# Patient Record
Sex: Male | Born: 1975
Health system: Southern US, Community
[De-identification: ages and names within clinical notes are randomized; demographics above are authoritative.]

## PROBLEM LIST (undated history)

## (undated) DIAGNOSIS — I10 Essential (primary) hypertension: Secondary | ICD-10-CM

## (undated) DIAGNOSIS — I809 Phlebitis and thrombophlebitis of unspecified site: Secondary | ICD-10-CM

## (undated) DIAGNOSIS — D72829 Elevated white blood cell count, unspecified: Principal | ICD-10-CM

## (undated) DIAGNOSIS — E119 Type 2 diabetes mellitus without complications: Secondary | ICD-10-CM

## (undated) DIAGNOSIS — T1490XA Injury, unspecified, initial encounter: Secondary | ICD-10-CM

## (undated) DIAGNOSIS — I2699 Other pulmonary embolism without acute cor pulmonale: Secondary | ICD-10-CM

## (undated) HISTORY — PX: SKIN GRAFT: SHX250

## (undated) HISTORY — DX: Essential (primary) hypertension: I10

## (undated) HISTORY — DX: Type 2 diabetes mellitus without complications: E11.9

## (undated) HISTORY — DX: Elevated white blood cell count, unspecified: D72.829

---

## 2001-10-18 HISTORY — PX: OTHER SURGICAL HISTORY: SHX169

## 2002-01-18 ENCOUNTER — Emergency Department (HOSPITAL_COMMUNITY): Admission: EM | Admit: 2002-01-18 | Discharge: 2002-01-18 | Payer: Self-pay | Admitting: Emergency Medicine

## 2002-03-03 ENCOUNTER — Emergency Department (HOSPITAL_COMMUNITY): Admission: EM | Admit: 2002-03-03 | Discharge: 2002-03-03 | Payer: Self-pay | Admitting: Emergency Medicine

## 2002-03-13 ENCOUNTER — Emergency Department (HOSPITAL_COMMUNITY): Admission: EM | Admit: 2002-03-13 | Discharge: 2002-03-13 | Payer: Self-pay | Admitting: Emergency Medicine

## 2002-03-17 ENCOUNTER — Emergency Department (HOSPITAL_COMMUNITY): Admission: EM | Admit: 2002-03-17 | Discharge: 2002-03-17 | Payer: Self-pay | Admitting: Unknown Physician Specialty

## 2002-03-17 ENCOUNTER — Encounter: Payer: Self-pay | Admitting: Emergency Medicine

## 2002-03-21 ENCOUNTER — Emergency Department (HOSPITAL_COMMUNITY): Admission: EM | Admit: 2002-03-21 | Discharge: 2002-03-22 | Payer: Self-pay | Admitting: Emergency Medicine

## 2002-03-26 ENCOUNTER — Emergency Department (HOSPITAL_COMMUNITY): Admission: EM | Admit: 2002-03-26 | Discharge: 2002-03-26 | Payer: Self-pay | Admitting: Emergency Medicine

## 2002-04-08 ENCOUNTER — Emergency Department (HOSPITAL_COMMUNITY): Admission: EM | Admit: 2002-04-08 | Discharge: 2002-04-08 | Payer: Self-pay | Admitting: Emergency Medicine

## 2002-04-30 ENCOUNTER — Encounter (INDEPENDENT_AMBULATORY_CARE_PROVIDER_SITE_OTHER): Payer: Self-pay | Admitting: Specialist

## 2002-04-30 ENCOUNTER — Inpatient Hospital Stay (HOSPITAL_COMMUNITY): Admission: EM | Admit: 2002-04-30 | Discharge: 2002-05-01 | Payer: Self-pay | Admitting: Emergency Medicine

## 2002-05-14 ENCOUNTER — Emergency Department (HOSPITAL_COMMUNITY): Admission: EM | Admit: 2002-05-14 | Discharge: 2002-05-14 | Payer: Self-pay | Admitting: Emergency Medicine

## 2002-06-03 ENCOUNTER — Emergency Department (HOSPITAL_COMMUNITY): Admission: EM | Admit: 2002-06-03 | Discharge: 2002-06-03 | Payer: Self-pay | Admitting: Emergency Medicine

## 2002-06-12 ENCOUNTER — Emergency Department (HOSPITAL_COMMUNITY): Admission: EM | Admit: 2002-06-12 | Discharge: 2002-06-12 | Payer: Self-pay

## 2002-06-15 ENCOUNTER — Emergency Department (HOSPITAL_COMMUNITY): Admission: EM | Admit: 2002-06-15 | Discharge: 2002-06-15 | Payer: Self-pay | Admitting: Emergency Medicine

## 2002-06-20 ENCOUNTER — Ambulatory Visit (HOSPITAL_COMMUNITY): Admission: RE | Admit: 2002-06-20 | Discharge: 2002-06-20 | Payer: Self-pay | Admitting: Surgery

## 2002-06-20 ENCOUNTER — Encounter: Payer: Self-pay | Admitting: Surgery

## 2002-07-15 ENCOUNTER — Emergency Department (HOSPITAL_COMMUNITY): Admission: EM | Admit: 2002-07-15 | Discharge: 2002-07-15 | Payer: Self-pay

## 2002-07-23 ENCOUNTER — Emergency Department (HOSPITAL_COMMUNITY): Admission: EM | Admit: 2002-07-23 | Discharge: 2002-07-23 | Payer: Self-pay | Admitting: Emergency Medicine

## 2002-08-02 ENCOUNTER — Emergency Department (HOSPITAL_COMMUNITY): Admission: EM | Admit: 2002-08-02 | Discharge: 2002-08-02 | Payer: Self-pay | Admitting: Emergency Medicine

## 2002-08-10 ENCOUNTER — Emergency Department (HOSPITAL_COMMUNITY): Admission: EM | Admit: 2002-08-10 | Discharge: 2002-08-10 | Payer: Self-pay | Admitting: Emergency Medicine

## 2002-09-17 ENCOUNTER — Emergency Department (HOSPITAL_COMMUNITY): Admission: EM | Admit: 2002-09-17 | Discharge: 2002-09-17 | Payer: Self-pay | Admitting: Emergency Medicine

## 2002-11-14 ENCOUNTER — Encounter: Payer: Self-pay | Admitting: Emergency Medicine

## 2002-11-14 ENCOUNTER — Emergency Department (HOSPITAL_COMMUNITY): Admission: EM | Admit: 2002-11-14 | Discharge: 2002-11-14 | Payer: Self-pay | Admitting: Emergency Medicine

## 2003-03-25 ENCOUNTER — Emergency Department (HOSPITAL_COMMUNITY): Admission: AD | Admit: 2003-03-25 | Discharge: 2003-03-25 | Payer: Self-pay

## 2003-04-08 ENCOUNTER — Emergency Department (HOSPITAL_COMMUNITY): Admission: EM | Admit: 2003-04-08 | Discharge: 2003-04-08 | Payer: Self-pay | Admitting: Emergency Medicine

## 2003-09-01 ENCOUNTER — Emergency Department (HOSPITAL_COMMUNITY): Admission: EM | Admit: 2003-09-01 | Discharge: 2003-09-01 | Payer: Self-pay | Admitting: Emergency Medicine

## 2003-09-06 ENCOUNTER — Emergency Department (HOSPITAL_COMMUNITY): Admission: EM | Admit: 2003-09-06 | Discharge: 2003-09-06 | Payer: Self-pay | Admitting: Emergency Medicine

## 2003-10-06 ENCOUNTER — Emergency Department (HOSPITAL_COMMUNITY): Admission: EM | Admit: 2003-10-06 | Discharge: 2003-10-06 | Payer: Self-pay | Admitting: Emergency Medicine

## 2003-10-13 ENCOUNTER — Emergency Department (HOSPITAL_COMMUNITY): Admission: EM | Admit: 2003-10-13 | Discharge: 2003-10-13 | Payer: Self-pay | Admitting: Emergency Medicine

## 2003-11-29 ENCOUNTER — Emergency Department (HOSPITAL_COMMUNITY): Admission: EM | Admit: 2003-11-29 | Discharge: 2003-11-30 | Payer: Self-pay | Admitting: Emergency Medicine

## 2004-04-11 ENCOUNTER — Emergency Department (HOSPITAL_COMMUNITY): Admission: EM | Admit: 2004-04-11 | Discharge: 2004-04-11 | Payer: Self-pay | Admitting: Emergency Medicine

## 2004-05-15 ENCOUNTER — Emergency Department (HOSPITAL_COMMUNITY): Admission: EM | Admit: 2004-05-15 | Discharge: 2004-05-15 | Payer: Self-pay | Admitting: Emergency Medicine

## 2004-06-15 ENCOUNTER — Emergency Department (HOSPITAL_COMMUNITY): Admission: EM | Admit: 2004-06-15 | Discharge: 2004-06-15 | Payer: Self-pay | Admitting: Emergency Medicine

## 2004-09-03 ENCOUNTER — Emergency Department (HOSPITAL_COMMUNITY): Admission: EM | Admit: 2004-09-03 | Discharge: 2004-09-03 | Payer: Self-pay | Admitting: Emergency Medicine

## 2004-09-06 ENCOUNTER — Emergency Department (HOSPITAL_COMMUNITY): Admission: EM | Admit: 2004-09-06 | Discharge: 2004-09-06 | Payer: Self-pay | Admitting: Emergency Medicine

## 2004-09-07 ENCOUNTER — Observation Stay (HOSPITAL_COMMUNITY): Admission: EM | Admit: 2004-09-07 | Discharge: 2004-09-08 | Payer: Self-pay | Admitting: Emergency Medicine

## 2004-09-07 ENCOUNTER — Emergency Department (HOSPITAL_COMMUNITY): Admission: EM | Admit: 2004-09-07 | Discharge: 2004-09-08 | Payer: Self-pay | Admitting: Emergency Medicine

## 2004-10-17 ENCOUNTER — Emergency Department (HOSPITAL_COMMUNITY): Admission: EM | Admit: 2004-10-17 | Discharge: 2004-10-17 | Payer: Self-pay | Admitting: Emergency Medicine

## 2004-11-22 ENCOUNTER — Emergency Department (HOSPITAL_COMMUNITY): Admission: EM | Admit: 2004-11-22 | Discharge: 2004-11-22 | Payer: Self-pay | Admitting: Emergency Medicine

## 2005-02-22 ENCOUNTER — Emergency Department (HOSPITAL_COMMUNITY): Admission: EM | Admit: 2005-02-22 | Discharge: 2005-02-23 | Payer: Self-pay | Admitting: Emergency Medicine

## 2005-06-23 ENCOUNTER — Emergency Department (HOSPITAL_COMMUNITY): Admission: EM | Admit: 2005-06-23 | Discharge: 2005-06-23 | Payer: Self-pay | Admitting: Emergency Medicine

## 2005-07-10 ENCOUNTER — Emergency Department (HOSPITAL_COMMUNITY): Admission: EM | Admit: 2005-07-10 | Discharge: 2005-07-10 | Payer: Self-pay | Admitting: Emergency Medicine

## 2005-07-13 ENCOUNTER — Emergency Department (HOSPITAL_COMMUNITY): Admission: EM | Admit: 2005-07-13 | Discharge: 2005-07-13 | Payer: Self-pay | Admitting: Family Medicine

## 2005-11-14 ENCOUNTER — Emergency Department (HOSPITAL_COMMUNITY): Admission: EM | Admit: 2005-11-14 | Discharge: 2005-11-14 | Payer: Self-pay | Admitting: Emergency Medicine

## 2006-01-03 ENCOUNTER — Emergency Department (HOSPITAL_COMMUNITY): Admission: EM | Admit: 2006-01-03 | Discharge: 2006-01-03 | Payer: Self-pay | Admitting: Emergency Medicine

## 2006-02-13 ENCOUNTER — Emergency Department (HOSPITAL_COMMUNITY): Admission: EM | Admit: 2006-02-13 | Discharge: 2006-02-13 | Payer: Self-pay | Admitting: Emergency Medicine

## 2006-04-05 ENCOUNTER — Emergency Department (HOSPITAL_COMMUNITY): Admission: EM | Admit: 2006-04-05 | Discharge: 2006-04-05 | Payer: Self-pay | Admitting: Family Medicine

## 2006-04-08 ENCOUNTER — Emergency Department (HOSPITAL_COMMUNITY): Admission: EM | Admit: 2006-04-08 | Discharge: 2006-04-08 | Payer: Self-pay | Admitting: Family Medicine

## 2006-05-21 ENCOUNTER — Emergency Department (HOSPITAL_COMMUNITY): Admission: EM | Admit: 2006-05-21 | Discharge: 2006-05-21 | Payer: Self-pay | Admitting: Emergency Medicine

## 2006-07-18 ENCOUNTER — Emergency Department (HOSPITAL_COMMUNITY): Admission: EM | Admit: 2006-07-18 | Discharge: 2006-07-18 | Payer: Self-pay | Admitting: Family Medicine

## 2006-10-05 ENCOUNTER — Emergency Department (HOSPITAL_COMMUNITY): Admission: EM | Admit: 2006-10-05 | Discharge: 2006-10-05 | Payer: Self-pay | Admitting: Emergency Medicine

## 2006-11-19 ENCOUNTER — Emergency Department (HOSPITAL_COMMUNITY): Admission: EM | Admit: 2006-11-19 | Discharge: 2006-11-19 | Payer: Self-pay | Admitting: Emergency Medicine

## 2007-02-01 ENCOUNTER — Emergency Department (HOSPITAL_COMMUNITY): Admission: EM | Admit: 2007-02-01 | Discharge: 2007-02-01 | Payer: Self-pay | Admitting: Emergency Medicine

## 2007-02-24 ENCOUNTER — Emergency Department (HOSPITAL_COMMUNITY): Admission: EM | Admit: 2007-02-24 | Discharge: 2007-02-25 | Payer: Self-pay | Admitting: Emergency Medicine

## 2007-04-04 ENCOUNTER — Emergency Department (HOSPITAL_COMMUNITY): Admission: EM | Admit: 2007-04-04 | Discharge: 2007-04-04 | Payer: Self-pay | Admitting: Emergency Medicine

## 2007-04-23 ENCOUNTER — Emergency Department (HOSPITAL_COMMUNITY): Admission: EM | Admit: 2007-04-23 | Discharge: 2007-04-23 | Payer: Self-pay | Admitting: Emergency Medicine

## 2007-05-01 ENCOUNTER — Emergency Department (HOSPITAL_COMMUNITY): Admission: EM | Admit: 2007-05-01 | Discharge: 2007-05-02 | Payer: Self-pay | Admitting: Emergency Medicine

## 2007-05-02 ENCOUNTER — Emergency Department (HOSPITAL_COMMUNITY): Admission: EM | Admit: 2007-05-02 | Discharge: 2007-05-02 | Payer: Self-pay | Admitting: Emergency Medicine

## 2007-05-03 ENCOUNTER — Emergency Department (HOSPITAL_COMMUNITY): Admission: EM | Admit: 2007-05-03 | Discharge: 2007-05-04 | Payer: Self-pay | Admitting: Emergency Medicine

## 2007-06-09 ENCOUNTER — Emergency Department (HOSPITAL_COMMUNITY): Admission: EM | Admit: 2007-06-09 | Discharge: 2007-06-09 | Payer: Self-pay | Admitting: Emergency Medicine

## 2007-08-15 ENCOUNTER — Emergency Department (HOSPITAL_COMMUNITY): Admission: EM | Admit: 2007-08-15 | Discharge: 2007-08-15 | Payer: Self-pay | Admitting: Emergency Medicine

## 2008-02-17 ENCOUNTER — Emergency Department (HOSPITAL_COMMUNITY): Admission: EM | Admit: 2008-02-17 | Discharge: 2008-02-17 | Payer: Self-pay | Admitting: Emergency Medicine

## 2008-04-04 ENCOUNTER — Ambulatory Visit: Payer: Self-pay | Admitting: Internal Medicine

## 2008-04-04 DIAGNOSIS — R0609 Other forms of dyspnea: Secondary | ICD-10-CM | POA: Insufficient documentation

## 2008-04-04 DIAGNOSIS — I10 Essential (primary) hypertension: Secondary | ICD-10-CM | POA: Insufficient documentation

## 2008-06-22 ENCOUNTER — Emergency Department (HOSPITAL_COMMUNITY): Admission: EM | Admit: 2008-06-22 | Discharge: 2008-06-22 | Payer: Self-pay | Admitting: Emergency Medicine

## 2008-08-30 ENCOUNTER — Emergency Department (HOSPITAL_COMMUNITY): Admission: EM | Admit: 2008-08-30 | Discharge: 2008-08-30 | Payer: Self-pay | Admitting: Emergency Medicine

## 2009-02-18 ENCOUNTER — Emergency Department (HOSPITAL_COMMUNITY): Admission: EM | Admit: 2009-02-18 | Discharge: 2009-02-18 | Payer: Self-pay | Admitting: Family Medicine

## 2009-02-20 ENCOUNTER — Emergency Department (HOSPITAL_COMMUNITY): Admission: EM | Admit: 2009-02-20 | Discharge: 2009-02-20 | Payer: Self-pay | Admitting: Emergency Medicine

## 2010-02-26 ENCOUNTER — Emergency Department (HOSPITAL_COMMUNITY): Admission: EM | Admit: 2010-02-26 | Discharge: 2010-02-26 | Payer: Self-pay | Admitting: Emergency Medicine

## 2010-04-23 ENCOUNTER — Emergency Department (HOSPITAL_COMMUNITY): Admission: EM | Admit: 2010-04-23 | Discharge: 2010-04-23 | Payer: Self-pay | Admitting: Family Medicine

## 2010-06-12 ENCOUNTER — Emergency Department (HOSPITAL_COMMUNITY): Admission: EM | Admit: 2010-06-12 | Discharge: 2010-06-12 | Payer: Self-pay | Admitting: Family Medicine

## 2010-06-12 ENCOUNTER — Emergency Department (HOSPITAL_COMMUNITY): Admission: EM | Admit: 2010-06-12 | Discharge: 2010-06-12 | Payer: Self-pay | Admitting: Emergency Medicine

## 2010-12-31 LAB — CULTURE, ROUTINE-ABSCESS

## 2011-01-03 LAB — CULTURE, ROUTINE-ABSCESS: Gram Stain: NONE SEEN

## 2011-01-26 LAB — CULTURE, ROUTINE-ABSCESS
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
## 2011-03-05 NOTE — Op Note (Signed)
NAME:  Dale Clark, Dale Clark                ACCOUNT NO.:  [id number]   MEDICAL RECORD NO.:  [id number]          PATIENT TYPE:  INP   LOCATION:  0370                         FACILITY:  [facility]   PHYSICIAN:  Leonie Man, M.D.   DATE OF BIRTH:  26-May-1976   DATE OF PROCEDURE:  09/07/2004  DATE OF DISCHARGE:  09/08/2004                                 OPERATIVE REPORT   PREOPERATIVE DIAGNOSIS:  Groin, scrotal, and perineal abscess.   POSTOPERATIVE DIAGNOSIS:  Groin, scrotal and perineal abscess.   PROCEDURE:  I&D of scrotal, groin, and perineal abscesses.   SURGEON:  Leonie Man, M.D.   ASSISTANT:  Nurse.   ANESTHESIA:  General.   INDICATIONS FOR PROCEDURE:  The patient is a 35 year old male who has known  bilateral groin and thigh and axillary hydradenitis who presents with an  acute scrotal abscess.  He has been draining for several days prior to this.  On presentation, he has a leukocytosis of 14,000.  He is febrile, with a  temperature of 100.3.  He comes to the operating room now for I&D after a  large groin abscess is noted extending into the left portion of the scrotal  sac and extending upward into the inguinal crease and down onto the  perineum.   The risks and potential benefits of surgery have been discussed with the  patient.  All questions were answered, and consent was obtained.   DESCRIPTION OF PROCEDURE:  Following the induction of satisfactory spinal  anesthesia with the patient positioned supine and then placed in the  lithotomy position, the perineum, groin, scrotum, and penis were prepped and  draped to be included in the sterile operative field.   A small sinus draining the lateral portion of the scrotum was then probed  with a hemostat opening up into the larger abscess and extending up into the  groin onto the scrotum and down to the perineum.  This was opened up widely  so as to filet the entire abscess cavity open.  Cultures for both aerobic  and anaerobic  were taken.  The entire abscess cavity was thoroughly  irrigated with normal saline.  All areas of loculations were disrupted.  The  wound was then packed with saline-soaked gauze sponge.  Sterile dressings  were applied.   The patient was then removed from the operating room to the recovery room in  stable condition.  He tolerated the procedure well.     Patr  PB/MEDQ  D:  09/07/2004  T:  09/08/2004  Job:  [id number]
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
## 2011-03-05 NOTE — Op Note (Signed)
[place]. [facility]  Patient:    Dale Clark, Dale Clark Visit Number: [id number] MRN: [id number]          Service Type: MED Location: [phone] Attending Physician:  Delsa Bern Dictated by:   Lorne Skeens. Hoxworth, M.D. Proc. Date: 04/30/02 Admit Date:  04/30/2002 Discharge Date: 05/01/2002                             Operative Report  PREOPERATIVE DIAGNOSIS:  Abscess, left buttock.  POSTOPERATIVE DIAGNOSIS:  Abscess, left buttock.  PROCEDURE:  Incision and drainage and debridement of abscess, left buttock.  SURGEON:  Lorne Skeens. Hoxworth, M.D.  ANESTHESIA:  General.  BRIEF HISTORY:  Dale Clark is a 35 year old black male with a long history of recurrent skin and soft tissue infection of the groin and buttock, apparently secondary to hidradenitis.  He now presents with several days of increased pain, swelling, and drainage of the left gluteal area.  Examination reveals multiple scars across both buttocks and up into the groin.  He has an area of previous incision in the groin.  In the left buttock are a number of small sinus tracts draining purulent material with underlying induration. Incision and drainage in the operating room has been recommended and accepted. The nature of the procedure, its indications, and risk of bleeding were discussed and understood preoperatively.  Now brought to the operating room for this procedure.  DESCRIPTION OF PROCEDURE:  The patient was brought to the operating room and placed in the supine position on the operating table, and general endotracheal anesthesia was induced.  He was carefully positioned in the lithotomy position and the perineum sterilely prepped and draped.  Examination as above revealed a number of draining sinus tracts in the left gluteal area with most of the induration and tracts concentrated in a 5-6 cm area on the medial anterior buttock.  There was no evidence of any tracking or  induration over toward the perirectal area.  This was seen most consistent with severe hidradenitis.  I was able to take a probe and pass the probe along a tract between two sinus tracts in the most indurated and fluctuant area and opened about a 4 cm long track into an abscess cavity.  Probed material was drained and cultured.  Some necrotic subcu was debrided.  Several other draining sinus surrounding this did not appear to track into the same process, nor did they track toward the rectum, and were essentially cored out, drained.  Hemostasis was obtained with cautery.  The soft tissue was infiltrated with Marcaine.  The wounds were packed with moist saline gauze and the patient taken to recovery in satisfactory condition. Dictated by:   Lorne Skeens. Hoxworth, M.D. Attending Physician:  Delsa Bern DD:  04/30/02 TD:  05/03/02 Job: [id number]

## 2011-03-05 NOTE — Consult Note (Signed)
NAME:  Dale Clark, Dale Clark NO.:  [id number]   MEDICAL RECORD NO.:  [id number]                   PATIENT TYPE:  EMS   LOCATION:  ED                                   FACILITY:  [facility]   PHYSICIAN:  Lorre Munroe., M.D.            DATE OF BIRTH:  07-Feb-1976   DATE OF CONSULTATION:  06/15/2004  DATE OF DISCHARGE:                                   CONSULTATION   CHIEF COMPLAINT:  Pain in buttock.   HISTORY OF PRESENT ILLNESS:  The patient is a 35 year old black male who has  had recurrent skin infections on numerous parts of his body and several in  the area of the buttocks.  He has never been diagnosed with perirectal or  pilonidal abscess.  He had been having pain for a couple of days and came in  for evaluation.  Dr. Lynelle Doctor attempted I&D and was not very successful in  getting any pus, so the patient was seen earlier by my associate, Dr.  Claud Kelp, who suggested a CT scan of the area and a pelvic CT scan has  been done showing no large deep abscess.  There is some swelling of the soft  tissues of the area of the buttock.  I am seeing the patient in followup.  He continues to have pain.  He has not had a fever or drainage.  He has no  conditions which produce immunodeficiency to his knowledge.  The white count  was normal.   PAST MEDICAL HISTORY:  He states his health is generally good and he is not  sure why he is subject to these infections.  He denies diabetes.  He is not  on any chronic medications.  He has frequently been given antibiotics.   ALLERGIES:  No known drug allergies.   REVIEW OF SYSTEMS:  Negative for pulmonary infections, symptoms of any heart  disease, and other serious chronic problems.   PHYSICAL EXAMINATION:  GENERAL:  A healthy-appearing man.  VITAL SIGNS:  Temperature and vital signs as recorded by the nurse.  HEENT:  Normal.  NECK:  Normal.  SKIN:  There were skin scars on several areas.  CHEST:  Clear to  auscultation.  HEART:  Regular rate and rhythm.  No murmur or gallop was detected.  ABDOMEN:  Nontender.  BUTTOCKS:  There was induration of the upper right buttock near the gluteal  cleft.  There were skin scars present.  There was some hardness of the  tissue in several areas without tenderness.   IMPRESSION:  Abscess of the right buttock.  It does not seem to be a  perirectal abscess.   PROCEDURE:  With the patient's permission, after thoroughly anesthetizing  the area, I incised and drained an abscess in the right buttock.  It was  fairly small, contained creamy pus, and did not communicate into the rectum  or  to deep areas  of the ischial rectal fossa or other areas.  After incising  it, I packed it with gauze and applied a bulky bandage and will arrange a  followup in my office in three to four days.  I also gave him a prescription  for Augmentin and for Vicodin.                                               Lorre Munroe., M.D.    Jodi Marble  D:  06/15/2004  T:  06/15/2004  Job:  [id number]

## 2011-03-05 NOTE — Consult Note (Signed)
NAME:  RICE, WALSH NO.:  [id number]   MEDICAL RECORD NO.:  [id number]          PATIENT TYPE:  EMS   LOCATION:  ED                           FACILITY:  [facility]   PHYSICIAN:  Valetta Fuller, M.D.  DATE OF BIRTH:  [birth year]   DATE OF CONSULTATION:  10/05/2006  DATE OF DISCHARGE:                                 CONSULTATION   REASON FOR CONSULTATION:  Left scrotal abscess slash cellulitis.   HISTORY OF PRESENT ILLNESS:  [name] is a 35 year old, African  American male.  He tells me he has had multiple treatments for boils  in his groin and under his arm.  He has had incision and drainage of  several of these in the past..  The patient tells me he noticed a small  boil on the left side of his hemiscrotum a few days ago and popped  that.  He subsequently has noticed increased induration and tenderness  in his left hemiscrotum.  He reports no fever or chills.  No voiding  complaints.Marland Kitchen  He presented to [facility] Room where he was  afebrile.  The ER physicians evaluated him and felt that he had a  scrotal wall abscess.  They requested that I come down to assess things.  The patient does not have any other systemic illnesses.   PAST MEDICAL HISTORY:  Otherwise unremarkable.   MEDICATIONS:  The patient takes no regular medications.   ALLERGIES:  No known drug allergies.   PHYSICAL EXAMINATION:  GENERAL:  He was a well-developed, well-nourished  male.  VITAL SIGNS:  He is afebrile in no acute distress and nontoxic in  appearance.  GENITALIA:  The penis and meatus were unremarkable.  Right hemiscrotum,  testes and adnexal structures were normal.  In the lateral aspect of  left hemiscrotum, there was an area of induration with a small area of  fluctuance.  The underlying testicle and epididymal structures appeared  to be normal and I did not feel that this infection tracked deep into  the scrotum.  There is no evidence of crepitus and no evidence of  a  necrotizing infection.   PROCEDURE:  The patient was prepped and draped in the usual manner on  the left hemiscrotum.  Some lidocaine was infiltrated overlying the  fluctuant area of the scrotum.  A small, 2 cm incision was made  overlying that skin and about 30 mL of purulent material was obtained.  There did not appear to be any undrained purulence and mostly this was  indurated tissue with just a small fluctuant area consistent with a  scrotal wall abscess.  This area was copiously irrigated and then packed  lightly with some Iodoform gauze.   ASSESSMENT:  Small scrotal wall abscess with probable cellulitis.  Cultures were obtained.   RECOMMENDATIONS:  The patient was empirically started on doxycycline x10  days.  He will need to establish a followup in our office, but we have  arranged for him to come back in 48 hours to the [facility] Emergency  Room for a wound check and then  will establish a followup in our office  as well.  I also provided him with some pain medication.           ______________________________  Valetta Fuller, M.D.  Electronically Signed     DSG/MEDQ  D:  10/05/2006  T:  10/06/2006  Job:  [id number]

## 2011-07-20 LAB — URINALYSIS, ROUTINE W REFLEX MICROSCOPIC
Bilirubin Urine: NEGATIVE
Hgb urine dipstick: NEGATIVE
Nitrite: NEGATIVE
Specific Gravity, Urine: 1.028
Urobilinogen, UA: 1
pH: 6.5

## 2011-07-28 LAB — CULTURE, ROUTINE-ABSCESS

## 2011-08-02 LAB — POCT I-STAT CREATININE: Operator id: 189501

## 2011-08-02 LAB — I-STAT 8, (EC8 V) (CONVERTED LAB)
Acid-Base Excess: 2
Chloride: 102
HCT: 50
Hemoglobin: 17
Potassium: 3.6
Sodium: 135
TCO2: 30
pH, Ven: 7.382 — ABNORMAL HIGH

## 2011-08-02 LAB — POCT CARDIAC MARKERS
CKMB, poc: 1.3
Troponin i, poc: <0.05

## 2011-08-02 LAB — D-DIMER, QUANTITATIVE: D-Dimer, Quant: 0.48

## 2011-08-03 LAB — BASIC METABOLIC PANEL
CO2: 30
Calcium: 9.2
Chloride: 101
GFR calc Af Amer: >60
Glucose, Bld: 105 — ABNORMAL HIGH
Potassium: 3.7
Sodium: 136

## 2011-08-04 LAB — I-STAT 8, (EC8 V) (CONVERTED LAB)
BUN: 8
Bicarbonate: 25.6 — ABNORMAL HIGH
Hemoglobin: 15.6
Operator id: 288331
Sodium: 137
TCO2: 27
pCO2, Ven: 42.2 — ABNORMAL LOW

## 2011-08-04 LAB — RAPID URINE DRUG SCREEN, HOSP PERFORMED
Amphetamines: NOT DETECTED
Barbiturates: NOT DETECTED
Benzodiazepines: POSITIVE — AB
Tetrahydrocannabinol: NOT DETECTED

## 2011-08-04 LAB — POCT CARDIAC MARKERS
CKMB, poc: <1 — ABNORMAL LOW
Myoglobin, poc: 97.2
Operator id: 146091
Troponin i, poc: <0.05

## 2011-10-06 ENCOUNTER — Emergency Department (HOSPITAL_COMMUNITY)
Admission: EM | Admit: 2011-10-06 | Discharge: 2011-10-07 | Discharge status: Against Medical Advice | Payer: BC Managed Care – PPO | Attending: Emergency Medicine | Admitting: Emergency Medicine

## 2011-10-06 ENCOUNTER — Encounter: Payer: Self-pay | Admitting: Emergency Medicine

## 2011-10-06 DIAGNOSIS — M7989 Other specified soft tissue disorders: Secondary | ICD-10-CM | POA: Insufficient documentation
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
## 2011-11-08 ENCOUNTER — Encounter (INDEPENDENT_AMBULATORY_CARE_PROVIDER_SITE_OTHER): Payer: Self-pay | Admitting: Surgery

## 2011-11-19 ENCOUNTER — Telehealth: Payer: Self-pay | Admitting: *Deleted

## 2011-11-19 NOTE — Telephone Encounter (Signed)
patient's wife confirmed patient appointment for 11-24-2011 starting at 12:15pm with financial counseling

## 2011-11-19 NOTE — Telephone Encounter (Signed)
Unable to reach patient at number provided by referring, called their office and retrieved another contact number of [phone]. Receptionist also states patient is returning to their office this afternoon for an appt and she will have him call scheduling to confirm appt for Tuesday 11/24/11, informed to ask to speak with new patient scheduling.

## 2011-11-19 NOTE — Telephone Encounter (Signed)
number disconnected

## 2011-11-20 ENCOUNTER — Encounter (HOSPITAL_COMMUNITY): Payer: Self-pay | Admitting: Emergency Medicine

## 2011-11-20 ENCOUNTER — Emergency Department (INDEPENDENT_AMBULATORY_CARE_PROVIDER_SITE_OTHER)
Admission: EM | Admit: 2011-11-20 | Discharge: 2011-11-20 | Disposition: A | Discharge status: Home / Self Care | Payer: BC Managed Care – PPO | Source: Home / Self Care

## 2011-11-20 DIAGNOSIS — L732 Hidradenitis suppurativa: Secondary | ICD-10-CM

## 2011-11-20 HISTORY — DX: Phlebitis and thrombophlebitis of unspecified site: I80.9

## 2011-11-20 HISTORY — DX: Other pulmonary embolism without acute cor pulmonale: I26.99

## 2011-11-20 HISTORY — DX: Injury, unspecified, initial encounter: T14.90XA

## 2011-11-20 MED ORDER — DOXYCYCLINE HYCLATE 100 MG PO CAPS: 100.0000 mg | ORAL_CAPSULE | Freq: Two times a day (BID) | ORAL | Status: AC

## 2011-11-20 MED ORDER — CEPHALEXIN 500 MG PO CAPS: 500.0000 mg | ORAL_CAPSULE | Freq: Three times a day (TID) | ORAL | Status: AC

## 2011-11-20 MED ORDER — HYDROCODONE-ACETAMINOPHEN 5-325 MG PO TABS: 1.0000 | ORAL_TABLET | Freq: Four times a day (QID) | ORAL | Status: DC | PRN

## 2011-11-20 NOTE — ED Notes (Signed)
Boil started draining this am, first noticed boil 2 days ago.  Patient has been using warm compresses to site.  Reports abscess to scrotum.
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
## 2011-11-22 NOTE — ED Provider Notes (Signed)
Medical screening examination/treatment/procedure(s) were performed by non-physician practitioner and as supervising physician I was immediately available for consultation/collaboration.   KINDL,JAMES DOUGLAS MD.    James Douglas Kindl, MD 11/22/11 1807 

## 2011-11-24 ENCOUNTER — Other Ambulatory Visit: Payer: BC Managed Care – PPO | Admitting: Lab

## 2011-11-24 ENCOUNTER — Ambulatory Visit: Payer: BC Managed Care – PPO

## 2011-11-24 ENCOUNTER — Ambulatory Visit (HOSPITAL_BASED_OUTPATIENT_CLINIC_OR_DEPARTMENT_OTHER): Payer: BC Managed Care – PPO | Admitting: Physician Assistant

## 2011-11-24 ENCOUNTER — Other Ambulatory Visit: Payer: Self-pay | Admitting: Physician Assistant

## 2011-11-24 VITALS — BP 128/83 | HR 87 | Temp 98.4°F | Ht 70.0 in | Wt 198.3 lb

## 2011-11-24 DIAGNOSIS — Z7901 Long term (current) use of anticoagulants: Secondary | ICD-10-CM

## 2011-11-24 DIAGNOSIS — I2699 Other pulmonary embolism without acute cor pulmonale: Secondary | ICD-10-CM

## 2011-11-24 DIAGNOSIS — D689 Coagulation defect, unspecified: Secondary | ICD-10-CM

## 2011-11-24 LAB — CBC WITH DIFFERENTIAL/PLATELET
Basophils Absolute: 0.1 10*3/uL (ref 0.0–0.1)
Eosinophils Absolute: 0.2 10*3/uL (ref 0.0–0.5)
HGB: 14.1 g/dL (ref 13.0–17.1)
MCV: 94.2 fL (ref 79.3–98.0)
NEUT#: 3.1 10*3/uL (ref 1.5–6.5)
RDW: 13.1 % (ref 11.0–14.6)
lymph#: 2.6 10*3/uL (ref 0.9–3.3)
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
## 2011-11-29 LAB — HYPERCOAGULABLE PANEL, COMPREHENSIVE
AntiThromb III Func: 118 % (ref 76–126)
Anticardiolipin IgM: 3 MPL U/mL (ref <11)
Beta-2 Glyco I IgG: 9 G Units (ref <20)
Beta-2-Glycoprotein I IgA: 2 A Units (ref <20)
Beta-2-Glycoprotein I IgM: 1 M Units (ref <20)
DRVVT 1:1 Mix: 38.4 secs (ref 34.1–42.2)
PTT Lupus Anticoagulant: 50.4 secs — ABNORMAL HIGH (ref 28.0–43.0)
Protein C, Total: 71 % — ABNORMAL LOW (ref 72–160)
Protein S Activity: 36 % — ABNORMAL LOW (ref 69–129)

## 2011-11-29 LAB — COMPREHENSIVE METABOLIC PANEL
Albumin: 4 g/dL (ref 3.5–5.2)
BUN: 10 mg/dL (ref 6–23)
Calcium: 9.3 mg/dL (ref 8.4–10.5)
Chloride: 102 mEq/L (ref 96–112)
Glucose, Bld: 195 mg/dL — ABNORMAL HIGH (ref 70–99)
Potassium: 4.4 mEq/L (ref 3.5–5.3)

## 2011-11-30 ENCOUNTER — Ambulatory Visit (INDEPENDENT_AMBULATORY_CARE_PROVIDER_SITE_OTHER): Payer: BC Managed Care – PPO | Admitting: Family Medicine

## 2011-11-30 DIAGNOSIS — L03319 Cellulitis of trunk, unspecified: Secondary | ICD-10-CM

## 2011-11-30 DIAGNOSIS — D699 Hemorrhagic condition, unspecified: Secondary | ICD-10-CM

## 2011-11-30 DIAGNOSIS — L02219 Cutaneous abscess of trunk, unspecified: Secondary | ICD-10-CM

## 2011-11-30 DIAGNOSIS — M79609 Pain in unspecified limb: Secondary | ICD-10-CM

## 2011-11-30 MED ORDER — DOXYCYCLINE HYCLATE 100 MG PO TABS: 100.0000 mg | ORAL_TABLET | Freq: Two times a day (BID) | ORAL | Status: AC

## 2011-11-30 MED ORDER — HYDROCODONE-ACETAMINOPHEN 5-325 MG PO TABS: 2.0000 | ORAL_TABLET | Freq: Four times a day (QID) | ORAL | Status: AC | PRN

## 2011-11-30 NOTE — Patient Instructions (Addendum)
Start doxycycline 100mg  twice per day for infections, warm compresses 5 times per day to allow expression of discharge.  Return for recheck in 2 days, sooner if any fever or worsening of symptoms.  Call the clinic that is managing your coumadin and advise that you are being started on doxycycline as this may affect the dosing.  Return to the clinic or go to the nearest emergency room if any of your symptoms worsen or new symptoms occur.  For dry skin: Drink at least 64 ounces of water daily. Consider a humidifier for the room where you sleep. Bathe once daily. Avoid using HOT water, as it dries skin.  Avoid deodorant soaps (Dial is the worst!) and stick with gentle cleansers (I like Cetaphil Liquid Cleanser). After bathing, dry off completely, then apply a thick emollient cream (I like Cetaphil Moisturizing Cream). Apply the cream twice daily, or more!
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
## 2011-12-03 LAB — WOUND CULTURE
Gram Stain: NONE SEEN
Gram Stain: NONE SEEN

## 2011-12-04 ENCOUNTER — Encounter: Payer: Self-pay | Admitting: Oncology

## 2011-12-04 NOTE — Progress Notes (Signed)
Received Epp application without proof of bank statment. Sending income verification letter today.

## 2011-12-13 ENCOUNTER — Telehealth: Payer: Self-pay | Admitting: Physician Assistant

## 2011-12-13 NOTE — Telephone Encounter (Signed)
Patient wife called to check status of EPP application, informed wife that Brandt Loosen had mailed a letter to her on 12/04/2011, patient stated she would mail back what was needed for completion of the application.

## 2012-02-21 ENCOUNTER — Other Ambulatory Visit: Payer: BC Managed Care – PPO | Admitting: Lab

## 2012-02-28 ENCOUNTER — Ambulatory Visit: Payer: BC Managed Care – PPO | Admitting: Physician Assistant

## 2012-03-02 ENCOUNTER — Emergency Department (INDEPENDENT_AMBULATORY_CARE_PROVIDER_SITE_OTHER)
Admission: EM | Admit: 2012-03-02 | Discharge: 2012-03-02 | Disposition: A | Discharge status: Home / Self Care | Payer: BC Managed Care – PPO | Source: Home / Self Care | Attending: Emergency Medicine | Admitting: Emergency Medicine

## 2012-03-02 ENCOUNTER — Encounter (HOSPITAL_COMMUNITY): Payer: Self-pay | Admitting: Emergency Medicine

## 2012-03-02 DIAGNOSIS — L0291 Cutaneous abscess, unspecified: Secondary | ICD-10-CM

## 2012-03-02 DIAGNOSIS — L0390 Cellulitis, unspecified: Secondary | ICD-10-CM

## 2012-03-02 DIAGNOSIS — L732 Hidradenitis suppurativa: Secondary | ICD-10-CM

## 2012-03-02 MED ORDER — MUPIROCIN 2 % EX OINT: TOPICAL_OINTMENT | Freq: Three times a day (TID) | CUTANEOUS | Status: AC

## 2012-03-02 MED ORDER — TRAMADOL HCL 50 MG PO TABS: 100.0000 mg | ORAL_TABLET | Freq: Three times a day (TID) | ORAL | Status: AC | PRN

## 2012-03-02 MED ORDER — CEPHALEXIN 500 MG PO CAPS: 500.0000 mg | ORAL_CAPSULE | Freq: Three times a day (TID) | ORAL | Status: AC

## 2012-03-02 NOTE — ED Notes (Signed)
Last protime 2 weeks ago, managed by [facility] physicians.

## 2012-03-02 NOTE — ED Notes (Signed)
Boils under both arms, initially was right axilla, now including left axilla.  Reports both are draining.  Onset 3 days ago

## 2012-03-02 NOTE — Discharge Instructions (Signed)

## 2012-03-02 NOTE — ED Provider Notes (Signed)
Chief Complaint  Patient presents with  . Abscess    History of Present Illness:   Rawn is a 36 year old male who has had a history of recurring abscesses in his axillas, groin area, and abdomen. The current abscesses undergoing on for about 3 days, beginning first in the right axilla and spreading to the left axilla. They've been draining a small amount of pus. He denies any fever or chills. He has a history of pulmonary embolus. He's on Coumadin right now and his pro time is therapeutic.  Review of Systems:  Other than noted above, the patient denies any of the following symptoms: Systemic:  No fever, chills or sweats. Skin:  No rash or itching.  PMFSH:  Past medical history, family history, social history, meds, and allergies were reviewed.  No history of diabetes or prior history of abscesses or MRSA.  Physical Exam:   Vital signs:  BP 144/94  Pulse 100  Temp(Src) 98.2 F (36.8 C) (Oral)  Resp 16  SpO2 99% Skin:  He has hypertrophy and scarring in the skin in both her axillas due to recurring boils. He has an active boil in both right and left axilla with some drainage., otherwise normal.  No rash.  Procedure:  Verbal informed consent was obtained.  The patient was informed of the risks and benefits of the procedure and understands and accepts.  Identity of the patient was verified verbally and by wristband.   The abscess area described above was prepped with Betadine and alcohol and anesthetized with a total of 5 mL of 2% Xylocaine with epinephrine, 2.5 mL being used on each side.  Using a #11 scalpel blade, a singe straight incision was made into each of the the areas of fluctulence, yielding a minimal amount of prurulent drainage.  Routine cultures were obtained of each side.  Blunt dissection was used to break up loculations.  A sterile pressure dressing was applied.  Assessment:  The primary encounter diagnosis was Abscess. A diagnosis of Hidradenitis suppurativa was also  pertinent to this visit.  Plan:   1.  The following meds were prescribed:   New Prescriptions   CEPHALEXIN (KEFLEX) 500 MG CAPSULE    Take 1 capsule (500 mg total) by mouth 3 (three) times daily.   MUPIROCIN OINTMENT (BACTROBAN) 2 %    Apply topically 3 (three) times daily. Apply to both nostrils BID for 1 month.   TRAMADOL (ULTRAM) 50 MG TABLET    Take 2 tablets (100 mg total) by mouth every 8 (eight) hours as needed for pain.   2.  The patient was instructed in symptomatic care and handouts were given. 3.  The patient was instructed to leave the dressing in place for 24 hours, then remove and start washing with soap and water and applying antibiotic ointment. I suggested he may want to consider consulting a surgeon after he got off of the Coumadin for excision of the chronically infected skin in the axillas. He should also consult his primary care physician for a prothrombin time since he is on antibiotic.   Reuben Likes, MD 03/02/12 1210

## 2012-03-05 LAB — CULTURE, ROUTINE-ABSCESS: Gram Stain: NONE SEEN

## 2012-03-07 NOTE — ED Notes (Signed)
5/20  Abscess culture R axilla: rare Proteus Mirabilis, L axilla: mod. Proteus Mirabilis.  Pt. treated with Keflex.  Labs shown to Dr. Artis Flock and he said tx. was adequate. Vassie Moselle 03/07/2012

## 2012-07-08 ENCOUNTER — Emergency Department (HOSPITAL_COMMUNITY)
Admission: EM | Admit: 2012-07-08 | Discharge: 2012-07-08 | Disposition: A | Discharge status: Home / Self Care | Payer: BC Managed Care – PPO | Source: Home / Self Care | Attending: Emergency Medicine | Admitting: Emergency Medicine

## 2012-07-08 ENCOUNTER — Encounter (HOSPITAL_COMMUNITY): Payer: Self-pay | Admitting: Emergency Medicine

## 2012-07-08 DIAGNOSIS — K029 Dental caries, unspecified: Secondary | ICD-10-CM

## 2012-07-08 MED ORDER — BUPIVACAINE HCL (PF) 0.5 % IJ SOLN
1.0000 mL | Freq: Once | INTRAMUSCULAR | Status: AC
  Administered 2012-07-08: 1 mL

## 2012-07-08 MED ORDER — HYDROCODONE-ACETAMINOPHEN 5-325 MG PO TABS: ORAL_TABLET | ORAL | Status: DC

## 2012-07-08 MED ORDER — PENICILLIN V POTASSIUM 500 MG PO TABS: 500.0000 mg | ORAL_TABLET | Freq: Four times a day (QID) | ORAL | Status: DC

## 2012-07-08 MED ORDER — LIDOCAINE VISCOUS 2 % MT SOLN: 10.0000 mL | OROMUCOSAL | Status: DC | PRN

## 2012-07-08 MED ORDER — CHLORHEXIDINE GLUCONATE 0.12 % MT SOLN: OROMUCOSAL | Status: DC
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
## 2012-07-08 NOTE — ED Notes (Signed)
Pt c/o dental pain x1 day... Says it's his molar on the top left side.... Sx include: pain... Denies: swelling, fever, nausea, vomiting, diarrhea.... Would also like his PT/INR checked today.

## 2012-07-18 ENCOUNTER — Emergency Department (HOSPITAL_COMMUNITY): Payer: BC Managed Care – PPO

## 2012-07-18 ENCOUNTER — Emergency Department (HOSPITAL_COMMUNITY)
Admission: EM | Admit: 2012-07-18 | Discharge: 2012-07-18 | Disposition: A | Discharge status: Home / Self Care | Payer: BC Managed Care – PPO | Attending: Emergency Medicine | Admitting: Emergency Medicine

## 2012-07-18 ENCOUNTER — Encounter (HOSPITAL_COMMUNITY): Payer: Self-pay | Admitting: Emergency Medicine

## 2012-07-18 DIAGNOSIS — S62509B Fracture of unspecified phalanx of unspecified thumb, initial encounter for open fracture: Secondary | ICD-10-CM

## 2012-07-18 DIAGNOSIS — S61209A Unspecified open wound of unspecified finger without damage to nail, initial encounter: Secondary | ICD-10-CM | POA: Insufficient documentation

## 2012-07-18 DIAGNOSIS — S61012A Laceration without foreign body of left thumb without damage to nail, initial encounter: Secondary | ICD-10-CM

## 2012-07-18 DIAGNOSIS — Z7901 Long term (current) use of anticoagulants: Secondary | ICD-10-CM | POA: Insufficient documentation

## 2012-07-18 DIAGNOSIS — Z86711 Personal history of pulmonary embolism: Secondary | ICD-10-CM | POA: Insufficient documentation

## 2012-07-18 DIAGNOSIS — Z79899 Other long term (current) drug therapy: Secondary | ICD-10-CM | POA: Insufficient documentation

## 2012-07-18 DIAGNOSIS — S62639B Displaced fracture of distal phalanx of unspecified finger, initial encounter for open fracture: Secondary | ICD-10-CM | POA: Insufficient documentation

## 2012-07-18 DIAGNOSIS — F17200 Nicotine dependence, unspecified, uncomplicated: Secondary | ICD-10-CM | POA: Insufficient documentation

## 2012-07-18 DIAGNOSIS — W312XXA Contact with powered woodworking and forming machines, initial encounter: Secondary | ICD-10-CM | POA: Insufficient documentation

## 2012-07-18 LAB — PROTIME-INR
INR: 1.61 — ABNORMAL HIGH (ref 0.00–1.49)
Prothrombin Time: 18.6 seconds — ABNORMAL HIGH (ref 11.6–15.2)

## 2012-07-18 LAB — APTT: aPTT: 39 seconds — ABNORMAL HIGH (ref 24–37)

## 2012-07-18 MED ORDER — HYDROCODONE-ACETAMINOPHEN 5-325 MG PO TABS
1.0000 | ORAL_TABLET | Freq: Once | ORAL | Status: AC
  Administered 2012-07-18: 1 via ORAL
  Filled 2012-07-18: qty 1

## 2012-07-18 MED ORDER — CEFAZOLIN SODIUM 1 G IJ SOLR
1.0000 g | Freq: Once | INTRAMUSCULAR | Status: AC
  Administered 2012-07-18: 1 g via INTRAMUSCULAR
  Filled 2012-07-18: qty 10

## 2012-07-18 MED ORDER — HYDROCODONE-ACETAMINOPHEN 5-325 MG PO TABS: 1.0000 | ORAL_TABLET | ORAL | Status: DC | PRN

## 2012-07-18 MED ORDER — CEPHALEXIN 500 MG PO CAPS: 500.0000 mg | ORAL_CAPSULE | Freq: Three times a day (TID) | ORAL | Status: DC

## 2012-07-18 NOTE — ED Notes (Signed)
Pt has tip of L thumb cut. Bleeding controlled.

## 2012-07-18 NOTE — ED Notes (Signed)
PA Browning at bedside. 

## 2012-07-18 NOTE — ED Notes (Signed)
Ortho tech paged for application of finger splint.  

## 2012-07-18 NOTE — ED Notes (Signed)
Laceration to left thumb on table saw, laceration through tip of thumbnail and thumb approx 1"

## 2012-07-18 NOTE — ED Notes (Signed)
Ortho tech at bedside 
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
## 2012-07-18 NOTE — ED Provider Notes (Signed)
Left hand dominant male with laceration to distal left thumb after accident while using table saw. Vertical laceration to pad of thumb through to invasion of nail. Nail intact, no active bleeding, neurovascularly intact. Xray shows distal fracture with fragments that are displaced 2.5 mm. Discussed with Dr. Magnus Ivan given the open fracture. Recommends saline irrigation and loose closure with splint. Recommended Keflex as well and will see in office in the next 1-2 days. Tetanus is up-to-date.  Rodena Medin, PA-C 07/31/12 1225

## 2012-07-21 NOTE — ED Provider Notes (Signed)
Medical screening examination/treatment/procedure(s) were performed by non-physician practitioner and as supervising physician I was immediately available for consultation/collaboration.   Suzi Roots, MD 07/21/12 [phone]

## 2012-07-23 ENCOUNTER — Encounter (HOSPITAL_COMMUNITY): Payer: Self-pay | Admitting: *Deleted

## 2012-07-23 ENCOUNTER — Observation Stay (HOSPITAL_COMMUNITY)
Admission: EM | Admit: 2012-07-23 | Discharge: 2012-07-24 | Disposition: A | Discharge status: Home / Self Care | Payer: BC Managed Care – PPO | Attending: Emergency Medicine | Admitting: Emergency Medicine

## 2012-07-23 DIAGNOSIS — M7989 Other specified soft tissue disorders: Secondary | ICD-10-CM | POA: Insufficient documentation

## 2012-07-23 DIAGNOSIS — Z86711 Personal history of pulmonary embolism: Secondary | ICD-10-CM | POA: Insufficient documentation

## 2012-07-23 DIAGNOSIS — Z7901 Long term (current) use of anticoagulants: Secondary | ICD-10-CM | POA: Insufficient documentation

## 2012-07-23 DIAGNOSIS — L02419 Cutaneous abscess of limb, unspecified: Principal | ICD-10-CM | POA: Insufficient documentation

## 2012-07-23 DIAGNOSIS — L03119 Cellulitis of unspecified part of limb: Secondary | ICD-10-CM | POA: Insufficient documentation

## 2012-07-23 DIAGNOSIS — L02415 Cutaneous abscess of right lower limb: Secondary | ICD-10-CM

## 2012-07-23 DIAGNOSIS — M79609 Pain in unspecified limb: Secondary | ICD-10-CM | POA: Insufficient documentation

## 2012-07-23 DIAGNOSIS — L03115 Cellulitis of right lower limb: Secondary | ICD-10-CM

## 2012-07-23 LAB — PROTIME-INR: INR: 2.23 — ABNORMAL HIGH (ref 0.00–1.49)

## 2012-07-23 LAB — COMPREHENSIVE METABOLIC PANEL
ALT: 19 U/L (ref 0–53)
AST: 21 U/L (ref 0–37)
Albumin: 3.4 g/dL — ABNORMAL LOW (ref 3.5–5.2)
CO2: 23 mEq/L (ref 19–32)
Calcium: 8.9 mg/dL (ref 8.4–10.5)
Chloride: 98 mEq/L (ref 96–112)
Creatinine, Ser: 1.13 mg/dL (ref 0.50–1.35)
GFR calc non Af Amer: 83 mL/min — ABNORMAL LOW (ref >90)
Sodium: 133 mEq/L — ABNORMAL LOW (ref 135–145)
Total Bilirubin: 0.3 mg/dL (ref 0.3–1.2)

## 2012-07-23 LAB — CBC WITH DIFFERENTIAL/PLATELET
Basophils Absolute: 0 10*3/uL (ref 0.0–0.1)
Basophils Relative: 0 % (ref 0–1)
Lymphocytes Relative: 18 % (ref 12–46)
MCHC: 34.6 g/dL (ref 30.0–36.0)
Neutro Abs: 8 10*3/uL — ABNORMAL HIGH (ref 1.7–7.7)
Platelets: 322 10*3/uL (ref 150–400)
RDW: 13.2 % (ref 11.5–15.5)
WBC: 11 10*3/uL — ABNORMAL HIGH (ref 4.0–10.5)

## 2012-07-23 LAB — APTT: aPTT: 73 seconds — ABNORMAL HIGH (ref 24–37)

## 2012-07-23 MED ORDER — MORPHINE SULFATE 4 MG/ML IJ SOLN
4.0000 mg | INTRAMUSCULAR | Status: DC | PRN
  Administered 2012-07-24: 4 mg via INTRAVENOUS
  Filled 2012-07-23: qty 1

## 2012-07-23 MED ORDER — ONDANSETRON HCL 4 MG/2ML IJ SOLN: 4.0000 mg | Freq: Four times a day (QID) | INTRAMUSCULAR | Status: DC | PRN

## 2012-07-23 MED ORDER — CLINDAMYCIN PHOSPHATE 600 MG/50ML IV SOLN
600.0000 mg | Freq: Once | INTRAVENOUS | Status: AC
  Administered 2012-07-23: 600 mg via INTRAVENOUS
  Filled 2012-07-23: qty 50

## 2012-07-23 MED ORDER — ACETAMINOPHEN 325 MG PO TABS: 650.0000 mg | ORAL_TABLET | ORAL | Status: DC | PRN

## 2012-07-23 NOTE — ED Notes (Signed)
Pt c/o right leg swelling since yesterday; c/o pain right inner thigh; history of dvt--takes coumadin for same

## 2012-07-24 DIAGNOSIS — M79609 Pain in unspecified limb: Secondary | ICD-10-CM

## 2012-07-24 DIAGNOSIS — M7989 Other specified soft tissue disorders: Secondary | ICD-10-CM

## 2012-07-24 MED ORDER — CLINDAMYCIN HCL 150 MG PO CAPS: 300.0000 mg | ORAL_CAPSULE | Freq: Four times a day (QID) | ORAL | Status: AC

## 2012-07-24 MED ORDER — CLINDAMYCIN PHOSPHATE 600 MG/50ML IV SOLN
600.0000 mg | Freq: Once | INTRAVENOUS | Status: AC
  Administered 2012-07-24: 600 mg via INTRAVENOUS
  Filled 2012-07-24: qty 50

## 2012-07-24 MED ORDER — LIDOCAINE-EPINEPHRINE (PF) 1 %-1:200000 IJ SOLN
INTRAMUSCULAR | Status: AC
  Administered 2012-07-24: 04:00:00
  Filled 2012-07-24: qty 10
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
## 2012-07-24 NOTE — ED Provider Notes (Signed)
Pt turned over to me at change of shift to get results of doppler US of RLE. Pt also had abscess drained today and is getting IV antibiotics for cellulities. Pt currently on coumadin, but had a subtherapeutic level recently (10/1), although on low end of therapeutic today. Has hx of DVT with PE which is why he is on the coumadin.   Pt has mild diffuse swelling of his RLE with mild warmth.   VASCULAR LAB  PRELIMINARY PRELIMINARY PRELIMINARY PRELIMINARY  Right lower extremity venous duplex completed.  Preliminary report: Right: No evidence of DVT, superficial thrombosis, or Baker's cyst.  SLAUGHTER, [place], RVS  07/24/2012, 8:46 AM  Diagnoses that have been ruled out:  None  Diagnoses that are still under consideration:  None  Final diagnoses:  Abscess of right thigh  Cellulitis of right thigh   New Prescriptions   CLINDAMYCIN (CLEOCIN) 150 MG CAPSULE    Take 2 capsules (300 mg total) by mouth every 6 (six) hours.      Plan discharge    Devoria Albe, MD, Franz Dell, MD 07/24/12 [phone]

## 2012-07-24 NOTE — Progress Notes (Signed)
VASCULAR LAB PRELIMINARY  PRELIMINARY  PRELIMINARY  PRELIMINARY  Right lower extremity venous duplex completed.    Preliminary report:  Right:  No evidence of DVT, superficial thrombosis, or Baker's cyst.  [place], RVS 07/24/2012, 8:46 AM

## 2012-07-26 LAB — WOUND CULTURE: Special Requests: NORMAL

## 2012-07-27 NOTE — ED Notes (Signed)
+   MRSA Patient treated with Clindamycin-sensitive to same-chart appended per protocol MD.

## 2012-07-29 ENCOUNTER — Telehealth (HOSPITAL_COMMUNITY): Payer: Self-pay | Admitting: Emergency Medicine

## 2012-07-30 ENCOUNTER — Telehealth (HOSPITAL_COMMUNITY): Payer: Self-pay | Admitting: Emergency Medicine

## 2012-08-01 NOTE — ED Provider Notes (Signed)
Medical screening examination/treatment/procedure(s) were performed by non-physician practitioner and as supervising physician I was immediately available for consultation/collaboration.   Suzi Roots, MD 08/01/12 [phone]

## 2012-09-28 ENCOUNTER — Emergency Department (HOSPITAL_COMMUNITY)
Admission: EM | Admit: 2012-09-28 | Discharge: 2012-09-28 | Disposition: A | Discharge status: Home / Self Care | Payer: BC Managed Care – PPO | Source: Home / Self Care | Attending: Emergency Medicine | Admitting: Emergency Medicine

## 2012-09-28 ENCOUNTER — Encounter (HOSPITAL_COMMUNITY): Payer: Self-pay | Admitting: Emergency Medicine

## 2012-09-28 DIAGNOSIS — L0291 Cutaneous abscess, unspecified: Secondary | ICD-10-CM

## 2012-09-28 DIAGNOSIS — L0390 Cellulitis, unspecified: Secondary | ICD-10-CM

## 2012-09-28 MED ORDER — SULFAMETHOXAZOLE-TMP DS 800-160 MG PO TABS: 2.0000 | ORAL_TABLET | Freq: Two times a day (BID) | ORAL | Status: DC

## 2012-09-28 MED ORDER — TRAMADOL HCL 50 MG PO TABS
100.0000 mg | ORAL_TABLET | Freq: Three times a day (TID) | ORAL | Status: DC | PRN
Start: 2012-09-28 — End: 2013-02-05

## 2012-09-28 MED ORDER — HYDROCODONE-ACETAMINOPHEN 5-325 MG PO TABS: ORAL_TABLET | ORAL | Status: DC

## 2012-09-28 MED ORDER — TRAMADOL HCL 50 MG PO TABS: 100.0000 mg | ORAL_TABLET | Freq: Three times a day (TID) | ORAL | Status: DC | PRN

## 2012-09-28 NOTE — ED Provider Notes (Signed)
Chief Complaint  Patient presents with  . Abscess    History of Present Illness:    Dale Clark is a 36 year old male who has had a two-day history of a boil on his left medial thigh. He had a boil on the right medial thigh about 2 months ago and it came back positive for MRSA. He denies any fever or chills. This boil has been draining a little bit of pus. He has a history of hypertension and pulmonary embolus and is on warfarin and a blood pressure medication.  Review of Systems:  Other than noted above, the patient denies any of the following symptoms: Systemic:  No fever, chills or sweats. Skin:  No rash or itching.  PMFSH:  Past medical history, family history, social history, meds, and allergies were reviewed.  No history of diabetes or prior history of abscesses or MRSA.  Physical Exam:   Vital signs:  BP 126/67  Pulse 93  Temp 98.1 F (36.7 C) (Oral)  Resp 16  SpO2 100% Skin:  On both medial thighs there are what appears to be multiple old, healed abscesses with induration, scarring, and thickening of the skin. On the left medial thigh there is an active abscess with fluctuance measuring 1.5 x 2.5 cm. This was draining a small amount of yellow pus.  Skin exam was otherwise normal.  No rash. Ext:  Distal pulses were full, patient has full ROM of all joints.  Procedure:  Verbal informed consent was obtained.  The patient was informed of the risks and benefits of the procedure and understands and accepts.  Identity of the patient was verified verbally and by wristband.   The abscess area described above was prepped with Betadine and alcohol and anesthetized with 5 mL of 2% Xylocaine with epinephrine.  Using a #11 scalpel blade, a singe straight incision was made into the area of fluctulence, yielding a large amount of prurulent drainage.  Routine cultures were obtained.  Blunt dissection was used to break up loculations and the resulting wound cavity was packed with 1/4 inch Iodoform gauze.  A  sterile pressure dressing was applied.  Assessment:  The encounter diagnosis was Abscess.  He appears to have multiple, healed abscesses on both medial thighs. This may be a variant of hidradenitis suppurativa.  Plan:   1.  The following meds were prescribed:   New Prescriptions   HYDROCODONE-ACETAMINOPHEN (NORCO/VICODIN) 5-325 MG PER TABLET    1 to 2 tabs every 4 to 6 hours as needed for pain.   SULFAMETHOXAZOLE-TRIMETHOPRIM (BACTRIM DS) 800-160 MG PER TABLET    Take 2 tablets by mouth 2 (two) times daily.   TRAMADOL (ULTRAM) 50 MG TABLET    Take 2 tablets (100 mg total) by mouth every 8 (eight) hours as needed for pain.   2.  The patient was instructed in symptomatic care and handouts were given. 3.  The patient was instructed to leave the dressing in place and return again in 48 hours for packing removal.   Reuben Likes, MD 09/28/12 2212

## 2012-09-28 NOTE — ED Notes (Signed)
Pt called at 4:00 p.m stating that pain meds were not working. Dr. Lorenz Coaster informed. Pt given written med for pain at will be left at front desk. Pt voices understanding. Mw,cma

## 2012-09-28 NOTE — ED Notes (Signed)
Abscess on upper leg, left leg per patient information form

## 2012-10-01 LAB — CULTURE, ROUTINE-ABSCESS: Culture: NO GROWTH

## 2013-01-12 ENCOUNTER — Telehealth: Payer: Self-pay | Admitting: Oncology

## 2013-01-12 NOTE — Telephone Encounter (Signed)
Received forwarded message from desk nurse Deanna Artis) re pt being on schedule w/PR and needing reassigned. Pt was not on PR's schedule for May 2014. Pt was last seen by CS February 2013 and did not keep f/u appts for May 2013. Message sent back to desk nurse re above asking what should be done and who should pt see being that his last appt was more than a year ago.

## 2013-01-18 ENCOUNTER — Encounter: Payer: Self-pay | Admitting: Oncology

## 2013-01-18 ENCOUNTER — Telehealth: Payer: Self-pay | Admitting: Oncology

## 2013-01-18 NOTE — Telephone Encounter (Signed)
Former PR pt reassigned to [employer]. Pt scheduled per Deanna Artis. S/w pt re new provider and appt d/t. Letter mailed.

## 2013-02-05 ENCOUNTER — Emergency Department (HOSPITAL_COMMUNITY)
Admission: EM | Admit: 2013-02-05 | Discharge: 2013-02-05 | Disposition: A | Discharge status: Home / Self Care | Payer: Self-pay | Attending: Emergency Medicine | Admitting: Emergency Medicine

## 2013-02-05 ENCOUNTER — Encounter (HOSPITAL_COMMUNITY): Payer: Self-pay | Admitting: *Deleted

## 2013-02-05 DIAGNOSIS — M545 Low back pain, unspecified: Secondary | ICD-10-CM | POA: Insufficient documentation

## 2013-02-05 DIAGNOSIS — Z87828 Personal history of other (healed) physical injury and trauma: Secondary | ICD-10-CM | POA: Insufficient documentation

## 2013-02-05 DIAGNOSIS — Z86711 Personal history of pulmonary embolism: Secondary | ICD-10-CM | POA: Insufficient documentation

## 2013-02-05 DIAGNOSIS — Z7901 Long term (current) use of anticoagulants: Secondary | ICD-10-CM | POA: Insufficient documentation

## 2013-02-05 DIAGNOSIS — F17200 Nicotine dependence, unspecified, uncomplicated: Secondary | ICD-10-CM | POA: Insufficient documentation

## 2013-02-05 DIAGNOSIS — Z79899 Other long term (current) drug therapy: Secondary | ICD-10-CM | POA: Insufficient documentation

## 2013-02-05 DIAGNOSIS — Z8679 Personal history of other diseases of the circulatory system: Secondary | ICD-10-CM | POA: Insufficient documentation

## 2013-02-05 DIAGNOSIS — IMO0002 Reserved for concepts with insufficient information to code with codable children: Secondary | ICD-10-CM | POA: Insufficient documentation

## 2013-02-05 DIAGNOSIS — M549 Dorsalgia, unspecified: Secondary | ICD-10-CM

## 2013-02-05 DIAGNOSIS — L02411 Cutaneous abscess of right axilla: Secondary | ICD-10-CM

## 2013-02-05 MED ORDER — OXYCODONE-ACETAMINOPHEN 5-325 MG PO TABS
1.0000 | ORAL_TABLET | Freq: Once | ORAL | Status: AC
  Administered 2013-02-05: 1 via ORAL
  Filled 2013-02-05: qty 1

## 2013-02-05 MED ORDER — HYDROMORPHONE HCL PF 1 MG/ML IJ SOLN
1.0000 mg | Freq: Once | INTRAMUSCULAR | Status: AC
  Administered 2013-02-05: 1 mg via INTRAMUSCULAR
  Filled 2013-02-05 (×2): qty 1

## 2013-02-05 MED ORDER — DIAZEPAM 5 MG PO TABS: 5.0000 mg | ORAL_TABLET | Freq: Two times a day (BID) | ORAL | Status: DC

## 2013-02-05 MED ORDER — DIAZEPAM 5 MG PO TABS
5.0000 mg | ORAL_TABLET | Freq: Once | ORAL | Status: AC
  Administered 2013-02-05: 5 mg via ORAL
  Filled 2013-02-05: qty 1

## 2013-02-05 MED ORDER — OXYCODONE-ACETAMINOPHEN 5-325 MG PO TABS: 1.0000 | ORAL_TABLET | ORAL | Status: DC | PRN

## 2013-02-05 MED ORDER — CLINDAMYCIN HCL 150 MG PO CAPS: 300.0000 mg | ORAL_CAPSULE | Freq: Three times a day (TID) | ORAL | Status: DC

## 2013-02-05 NOTE — ED Notes (Signed)
Pt to the ED with c/o lower back pain and right arm/right chest pain for a couple of days. Pain increases with movement. Pt states standing is the most comfortable position.

## 2013-02-05 NOTE — ED Provider Notes (Signed)
Medical screening examination/treatment/procedure(s) were performed by non-physician practitioner and as supervising physician I was immediately available for consultation/collaboration.   Dale Clark. Oletta Lamas, MD 02/05/13 2130
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
## 2013-02-05 NOTE — ED Notes (Signed)
Presents to ED with new onset of LBP after waking up Friday. Pain has progressed. Has sharp pain with certain movement.  numbness or tingling in stationary position for periods of time.

## 2013-02-05 NOTE — ED Notes (Signed)
ABD pad, telfa, and tape applied to wound.

## 2013-02-09 ENCOUNTER — Emergency Department (HOSPITAL_COMMUNITY): Payer: Self-pay

## 2013-02-09 ENCOUNTER — Encounter (HOSPITAL_COMMUNITY): Payer: Self-pay | Admitting: Emergency Medicine

## 2013-02-09 ENCOUNTER — Emergency Department (HOSPITAL_COMMUNITY)
Admission: EM | Admit: 2013-02-09 | Discharge: 2013-02-10 | Disposition: A | Discharge status: Home / Self Care | Payer: Self-pay | Attending: Emergency Medicine | Admitting: Emergency Medicine

## 2013-02-09 DIAGNOSIS — F17200 Nicotine dependence, unspecified, uncomplicated: Secondary | ICD-10-CM | POA: Insufficient documentation

## 2013-02-09 DIAGNOSIS — R269 Unspecified abnormalities of gait and mobility: Secondary | ICD-10-CM | POA: Insufficient documentation

## 2013-02-09 DIAGNOSIS — Z7901 Long term (current) use of anticoagulants: Secondary | ICD-10-CM | POA: Insufficient documentation

## 2013-02-09 DIAGNOSIS — M545 Low back pain, unspecified: Secondary | ICD-10-CM | POA: Insufficient documentation

## 2013-02-09 DIAGNOSIS — Z86711 Personal history of pulmonary embolism: Secondary | ICD-10-CM | POA: Insufficient documentation

## 2013-02-09 DIAGNOSIS — M549 Dorsalgia, unspecified: Secondary | ICD-10-CM

## 2013-02-09 DIAGNOSIS — Z8679 Personal history of other diseases of the circulatory system: Secondary | ICD-10-CM | POA: Insufficient documentation

## 2013-02-09 DIAGNOSIS — Z79899 Other long term (current) drug therapy: Secondary | ICD-10-CM | POA: Insufficient documentation

## 2013-02-09 DIAGNOSIS — Z87828 Personal history of other (healed) physical injury and trauma: Secondary | ICD-10-CM | POA: Insufficient documentation

## 2013-02-09 LAB — URINALYSIS, ROUTINE W REFLEX MICROSCOPIC
Leukocytes, UA: NEGATIVE
Nitrite: NEGATIVE
Protein, ur: NEGATIVE mg/dL
Specific Gravity, Urine: 1.031 — ABNORMAL HIGH (ref 1.005–1.030)
Urobilinogen, UA: 0.2 mg/dL (ref 0.0–1.0)

## 2013-02-09 MED ORDER — OXYCODONE-ACETAMINOPHEN 5-325 MG PO TABS
2.0000 | ORAL_TABLET | Freq: Once | ORAL | Status: AC
  Administered 2013-02-09: 2 via ORAL
  Filled 2013-02-09: qty 2

## 2013-02-09 MED ORDER — DIAZEPAM 5 MG PO TABS
10.0000 mg | ORAL_TABLET | Freq: Once | ORAL | Status: AC
  Administered 2013-02-09: 10 mg via ORAL
  Filled 2013-02-09: qty 2

## 2013-02-09 NOTE — ED Notes (Signed)
Pt c/o low back pain onset 4/18. Pt unable to sit in triage chair.
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
## 2013-02-10 ENCOUNTER — Emergency Department (HOSPITAL_COMMUNITY): Payer: Self-pay

## 2013-02-10 LAB — PROTIME-INR
INR: 2.03 — ABNORMAL HIGH (ref 0.00–1.49)
Prothrombin Time: 22.1 s — ABNORMAL HIGH (ref 11.6–15.2)

## 2013-02-10 MED ORDER — GADOBENATE DIMEGLUMINE 529 MG/ML IV SOLN
20.0000 mL | Freq: Once | INTRAVENOUS | Status: AC
  Administered 2013-02-10: 20 mL via INTRAVENOUS

## 2013-02-10 MED ORDER — HYDROCODONE-ACETAMINOPHEN 5-325 MG PO TABS: 2.0000 | ORAL_TABLET | ORAL | Status: DC | PRN

## 2013-02-10 MED ORDER — ONDANSETRON HCL 4 MG/2ML IJ SOLN
4.0000 mg | Freq: Once | INTRAMUSCULAR | Status: AC
  Administered 2013-02-10: 4 mg via INTRAVENOUS
  Filled 2013-02-10: qty 2

## 2013-02-10 MED ORDER — MORPHINE SULFATE 4 MG/ML IJ SOLN
4.0000 mg | Freq: Once | INTRAMUSCULAR | Status: AC
  Administered 2013-02-10: 4 mg via INTRAVENOUS
  Filled 2013-02-10: qty 1

## 2013-02-10 MED ORDER — HYDROMORPHONE HCL PF 1 MG/ML IJ SOLN
1.0000 mg | Freq: Once | INTRAMUSCULAR | Status: AC
  Administered 2013-02-10: 1 mg via INTRAVENOUS
  Filled 2013-02-10: qty 1

## 2013-02-10 MED ORDER — DIAZEPAM 5 MG PO TABS: 5.0000 mg | ORAL_TABLET | Freq: Two times a day (BID) | ORAL | Status: DC

## 2013-02-10 NOTE — ED Notes (Signed)
Patient transported back from MRI per stretcher.

## 2013-02-10 NOTE — ED Notes (Signed)
Pt. Did not have a CT done here.

## 2013-02-10 NOTE — ED Notes (Signed)
Received report from [facility]. Pt. Sent here from [facility] for MRI of his Back.

## 2013-02-10 NOTE — ED Provider Notes (Signed)
2:04 AM PT evaluated - transferred from [facility] ED for MRI L spine r/o epidural hematoma. Has back pain and is on coumadin.  PT had pain meds PTA and is currently comfortable.  I spoke with Radiologist who was aware of PT but not aware that he was being transferred to [facility] ED. RAD attempting to call in MRI team at this time.   4:39 AM IV Dilaudid for returning pain. MRI prelim report reviewed - "small central and left paracentral disc protrusion at L5-S1 causing some effacement of the left lateral recess..." results discussed with radiologist.   Pain improving  - PT has physician at [facility] and wishes to f/u NSG there. Local referral also provided. RX provided, no deficits on exam.   Sunnie Nielsen, MD 02/10/13 1610

## 2013-02-10 NOTE — ED Provider Notes (Signed)
Medical screening examination/treatment/procedure(s) were performed by non-physician practitioner and as supervising physician I was immediately available for consultation/collaboration.    Celene Kras, MD 02/10/13 1630

## 2013-02-12 LAB — POCT I-STAT, CHEM 8
BUN: 11 mg/dL (ref 6–23)
Calcium, Ion: 1.2 mmol/L (ref 1.12–1.23)
HCT: 44 % (ref 39.0–52.0)
TCO2: 27 mmol/L (ref 0–100)

## 2013-03-07 ENCOUNTER — Ambulatory Visit: Payer: BC Managed Care – PPO | Admitting: Oncology

## 2013-03-08 ENCOUNTER — Encounter: Payer: Self-pay | Admitting: Oncology

## 2013-03-08 NOTE — Progress Notes (Signed)
This patient is listed as a former patient of Dr. Donnie Coffin. I cannot find any documentation that he has ever been seen in this office either an apical or the mosaic computer system. There is a vague reference and a orthopedic surgery note that he had a history of a pulmonary embolism and used to get his Coumadin monitored in our office.

## 2013-04-11 ENCOUNTER — Emergency Department (HOSPITAL_COMMUNITY)
Admission: EM | Admit: 2013-04-11 | Discharge: 2013-04-11 | Disposition: A | Discharge status: Home / Self Care | Payer: Self-pay | Attending: Emergency Medicine | Admitting: Emergency Medicine

## 2013-04-11 DIAGNOSIS — L0291 Cutaneous abscess, unspecified: Secondary | ICD-10-CM

## 2013-04-11 DIAGNOSIS — Z86711 Personal history of pulmonary embolism: Secondary | ICD-10-CM | POA: Insufficient documentation

## 2013-04-11 DIAGNOSIS — L732 Hidradenitis suppurativa: Secondary | ICD-10-CM

## 2013-04-11 DIAGNOSIS — Z8679 Personal history of other diseases of the circulatory system: Secondary | ICD-10-CM | POA: Insufficient documentation

## 2013-04-11 DIAGNOSIS — F17200 Nicotine dependence, unspecified, uncomplicated: Secondary | ICD-10-CM | POA: Insufficient documentation

## 2013-04-11 DIAGNOSIS — L03317 Cellulitis of buttock: Secondary | ICD-10-CM | POA: Insufficient documentation

## 2013-04-11 DIAGNOSIS — Z87828 Personal history of other (healed) physical injury and trauma: Secondary | ICD-10-CM | POA: Insufficient documentation

## 2013-04-11 DIAGNOSIS — Z7901 Long term (current) use of anticoagulants: Secondary | ICD-10-CM | POA: Insufficient documentation

## 2013-04-11 DIAGNOSIS — L0231 Cutaneous abscess of buttock: Secondary | ICD-10-CM | POA: Insufficient documentation

## 2013-04-11 DIAGNOSIS — Z79899 Other long term (current) drug therapy: Secondary | ICD-10-CM | POA: Insufficient documentation

## 2013-04-11 MED ORDER — AMOXICILLIN-POT CLAVULANATE 875-125 MG PO TABS: 1.0000 | ORAL_TABLET | Freq: Two times a day (BID) | ORAL | Status: DC

## 2013-04-11 MED ORDER — OXYCODONE-ACETAMINOPHEN 5-325 MG PO TABS
2.0000 | ORAL_TABLET | Freq: Once | ORAL | Status: AC
  Administered 2013-04-11: 2 via ORAL
  Filled 2013-04-11: qty 2

## 2013-04-11 MED ORDER — CEFTRIAXONE SODIUM 250 MG IJ SOLR
250.0000 mg | Freq: Once | INTRAMUSCULAR | Status: AC
  Administered 2013-04-11: 250 mg via INTRAMUSCULAR
  Filled 2013-04-11: qty 250

## 2013-04-11 MED ORDER — HYDROCODONE-ACETAMINOPHEN 5-325 MG PO TABS: 2.0000 | ORAL_TABLET | Freq: Four times a day (QID) | ORAL | Status: DC | PRN

## 2013-04-11 NOTE — ED Notes (Signed)
Pt c/o abscess to base of buttocks of L side. States he first noticed it on Saturday. Pt has been placing warm compresses to area, but states area is not draining. Pt ambulatory to exam room with steady gait. Pt states he has a ride home.

## 2013-04-11 NOTE — ED Notes (Signed)
Surgeon at bedside speaking with pt. 
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
## 2013-04-11 NOTE — ED Provider Notes (Signed)
See prior note   Ward Givens, MD 04/11/13 2045

## 2013-04-11 NOTE — ED Provider Notes (Signed)
Pt has hx of abscesses since age 37. He reports pain in his left buttock. I was asked to assess the I & D done so far by PA [facility].   Pt has lost of round scars on his buttocks from prior abscesses. He has darkening of the skin of his medial left buttock with firmness of the skin in a large area. There is one incision distally that doesn't appear to be draining pus and there is another smaller incision medially that they did get some purulent drainage from. We discussed going deeper in the more lateral larger incision and if that doesn't find a pus pocket he should be referred to surgery.    Medical screening examination/treatment/procedure(s) were conducted as a shared visit with non-physician practitioner(s) and myself.  I personally evaluated the patient during the encounter  Devoria Albe, MD, Franz Dell, MD 04/11/13 [phone]
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
## 2013-04-12 ENCOUNTER — Telehealth (INDEPENDENT_AMBULATORY_CARE_PROVIDER_SITE_OTHER): Payer: Self-pay | Admitting: General Surgery

## 2013-04-12 NOTE — Telephone Encounter (Signed)
Urgent office appt made with patient for Tuesday.

## 2013-04-12 NOTE — Telephone Encounter (Signed)
Message copied by Liliana Cline on Thu Apr 12, 2013  9:17 AM ------      Message from: Glenna Fellows T      Created: Wed Apr 11, 2013  8:35 PM       Patient seen this evening in [facility] emergency room.  Has area of hidradenitis left buttock. Needs followup in the office Monday or Tuesday. I will be out of town so will need to be seen in the urgent office or by one of the other physicians. Please call patient to schedule. ------

## 2013-04-17 ENCOUNTER — Encounter (INDEPENDENT_AMBULATORY_CARE_PROVIDER_SITE_OTHER): Payer: Self-pay | Admitting: General Surgery

## 2013-04-17 ENCOUNTER — Telehealth (INDEPENDENT_AMBULATORY_CARE_PROVIDER_SITE_OTHER): Payer: Self-pay | Admitting: General Surgery

## 2013-04-17 NOTE — Telephone Encounter (Signed)
I did offer patient another appointment, patient denied at this time.

## 2013-04-17 NOTE — Telephone Encounter (Signed)
Patient stated reason for NS in Urgent Clinic 7/1: "My Wife broke her ankle this weekend, I probable won't be able to make it".

## 2013-12-14 ENCOUNTER — Telehealth: Payer: Self-pay | Admitting: Hematology and Oncology

## 2013-12-14 NOTE — Telephone Encounter (Signed)
Gave pt Appt for lab and MD , a former pt of Dr. Donnie Coffinubin

## 2013-12-31 ENCOUNTER — Encounter: Payer: Self-pay | Admitting: Hematology and Oncology

## 2013-12-31 ENCOUNTER — Other Ambulatory Visit: Payer: Self-pay | Admitting: Hematology and Oncology

## 2013-12-31 DIAGNOSIS — D72829 Elevated white blood cell count, unspecified: Secondary | ICD-10-CM

## 2013-12-31 HISTORY — DX: Elevated white blood cell count, unspecified: D72.829

## 2014-01-01 ENCOUNTER — Other Ambulatory Visit: Payer: Self-pay

## 2014-01-01 ENCOUNTER — Ambulatory Visit: Payer: Self-pay | Admitting: Hematology and Oncology

## 2014-03-16 ENCOUNTER — Encounter (HOSPITAL_COMMUNITY): Payer: Self-pay | Admitting: Emergency Medicine

## 2014-03-16 ENCOUNTER — Emergency Department (HOSPITAL_COMMUNITY): Payer: BC Managed Care – PPO

## 2014-03-16 ENCOUNTER — Emergency Department (HOSPITAL_COMMUNITY)
Admission: EM | Admit: 2014-03-16 | Discharge: 2014-03-16 | Disposition: A | Discharge status: Home / Self Care | Payer: BC Managed Care – PPO | Attending: Emergency Medicine | Admitting: Emergency Medicine

## 2014-03-16 DIAGNOSIS — Z7901 Long term (current) use of anticoagulants: Secondary | ICD-10-CM | POA: Insufficient documentation

## 2014-03-16 DIAGNOSIS — Z86711 Personal history of pulmonary embolism: Secondary | ICD-10-CM | POA: Insufficient documentation

## 2014-03-16 DIAGNOSIS — R748 Abnormal levels of other serum enzymes: Secondary | ICD-10-CM

## 2014-03-16 DIAGNOSIS — Z87828 Personal history of other (healed) physical injury and trauma: Secondary | ICD-10-CM | POA: Insufficient documentation

## 2014-03-16 DIAGNOSIS — J9801 Acute bronchospasm: Secondary | ICD-10-CM

## 2014-03-16 DIAGNOSIS — J189 Pneumonia, unspecified organism: Secondary | ICD-10-CM

## 2014-03-16 DIAGNOSIS — Z862 Personal history of diseases of the blood and blood-forming organs and certain disorders involving the immune mechanism: Secondary | ICD-10-CM | POA: Insufficient documentation

## 2014-03-16 DIAGNOSIS — J159 Unspecified bacterial pneumonia: Secondary | ICD-10-CM | POA: Insufficient documentation

## 2014-03-16 DIAGNOSIS — Z8672 Personal history of thrombophlebitis: Secondary | ICD-10-CM | POA: Insufficient documentation

## 2014-03-16 DIAGNOSIS — F17200 Nicotine dependence, unspecified, uncomplicated: Secondary | ICD-10-CM | POA: Insufficient documentation

## 2014-03-16 DIAGNOSIS — Z79899 Other long term (current) drug therapy: Secondary | ICD-10-CM | POA: Insufficient documentation

## 2014-03-16 LAB — CBC WITH DIFFERENTIAL/PLATELET
BASOS PCT: 0 % (ref 0–1)
Basophils Absolute: 0 10*3/uL (ref 0.0–0.1)
EOS ABS: 0.2 10*3/uL (ref 0.0–0.7)
Eosinophils Relative: 1 % (ref 0–5)
HEMATOCRIT: 38.4 % — AB (ref 39.0–52.0)
HEMOGLOBIN: 13.4 g/dL (ref 13.0–17.0)
LYMPHS ABS: 1.9 10*3/uL (ref 0.7–4.0)
Lymphocytes Relative: 16 % (ref 12–46)
MCH: 32.1 pg (ref 26.0–34.0)
MCHC: 34.9 g/dL (ref 30.0–36.0)
MCV: 92.1 fL (ref 78.0–100.0)
MONO ABS: 0.7 10*3/uL (ref 0.1–1.0)
MONOS PCT: 5 % (ref 3–12)
NEUTROS ABS: 9.7 10*3/uL — AB (ref 1.7–7.7)
NEUTROS PCT: 78 % — AB (ref 43–77)
Platelets: 322 10*3/uL (ref 150–400)
RBC: 4.17 MIL/uL — AB (ref 4.22–5.81)
RDW: 12.6 % (ref 11.5–15.5)
WBC: 12.5 10*3/uL — ABNORMAL HIGH (ref 4.0–10.5)

## 2014-03-16 LAB — ACETAMINOPHEN LEVEL: Acetaminophen (Tylenol), Serum: <15 ug/mL (ref 10–30)

## 2014-03-16 LAB — COMPREHENSIVE METABOLIC PANEL
ALK PHOS: 150 U/L — AB (ref 39–117)
ALT: 122 U/L — ABNORMAL HIGH (ref 0–53)
AST: 150 U/L — ABNORMAL HIGH (ref 0–37)
Albumin: 3.2 g/dL — ABNORMAL LOW (ref 3.5–5.2)
BILIRUBIN TOTAL: 0.4 mg/dL (ref 0.3–1.2)
BUN: 15 mg/dL (ref 6–23)
CHLORIDE: 99 meq/L (ref 96–112)
CO2: 22 mEq/L (ref 19–32)
CREATININE: 1.09 mg/dL (ref 0.50–1.35)
Calcium: 9.5 mg/dL (ref 8.4–10.5)
GFR, EST NON AFRICAN AMERICAN: 86 mL/min — AB (ref >90)
GLUCOSE: 203 mg/dL — AB (ref 70–99)
POTASSIUM: 4.3 meq/L (ref 3.7–5.3)
Sodium: 134 mEq/L — ABNORMAL LOW (ref 137–147)
Total Protein: 8.1 g/dL (ref 6.0–8.3)

## 2014-03-16 LAB — PROTIME-INR
INR: 2.02 — ABNORMAL HIGH (ref 0.00–1.49)
Prothrombin Time: 22.2 seconds — ABNORMAL HIGH (ref 11.6–15.2)

## 2014-03-16 LAB — TROPONIN I: Troponin I: <0.3 ng/mL (ref <0.30)

## 2014-03-16 LAB — PRO B NATRIURETIC PEPTIDE: Pro B Natriuretic peptide (BNP): 10.9 pg/mL (ref 0–125)

## 2014-03-16 MED ORDER — BENZONATATE 100 MG PO CAPS: 100.0000 mg | ORAL_CAPSULE | Freq: Three times a day (TID) | ORAL | Status: DC

## 2014-03-16 MED ORDER — METHYLPREDNISOLONE SODIUM SUCC 125 MG IJ SOLR
125.0000 mg | Freq: Once | INTRAMUSCULAR | Status: AC
  Administered 2014-03-16: 125 mg via INTRAVENOUS
  Filled 2014-03-16: qty 2

## 2014-03-16 MED ORDER — PREDNISONE 50 MG PO TABS: 50.0000 mg | ORAL_TABLET | Freq: Every day | ORAL | Status: DC

## 2014-03-16 MED ORDER — ALBUTEROL (5 MG/ML) CONTINUOUS INHALATION SOLN
10.0000 mg/h | INHALATION_SOLUTION | RESPIRATORY_TRACT | Status: DC
  Administered 2014-03-16: 10 mg/h via RESPIRATORY_TRACT
  Filled 2014-03-16: qty 20

## 2014-03-16 MED ORDER — ALBUTEROL SULFATE HFA 108 (90 BASE) MCG/ACT IN AERS
2.0000 | INHALATION_SPRAY | Freq: Once | RESPIRATORY_TRACT | Status: AC
  Administered 2014-03-16: 2 via RESPIRATORY_TRACT
  Filled 2014-03-16: qty 6.7

## 2014-03-16 MED ORDER — ALBUTEROL SULFATE (2.5 MG/3ML) 0.083% IN NEBU
5.0000 mg | INHALATION_SOLUTION | Freq: Once | RESPIRATORY_TRACT | Status: AC
  Administered 2014-03-16: 5 mg via RESPIRATORY_TRACT
  Filled 2014-03-16: qty 6

## 2014-03-16 MED ORDER — SODIUM CHLORIDE 0.9 % IV BOLUS (SEPSIS)
1000.0000 mL | Freq: Once | INTRAVENOUS | Status: AC
  Administered 2014-03-16: 1000 mL via INTRAVENOUS

## 2014-03-16 MED ORDER — LEVOFLOXACIN 750 MG PO TABS: 750.0000 mg | ORAL_TABLET | Freq: Every day | ORAL | Status: DC

## 2014-03-16 MED ORDER — LEVOFLOXACIN IN D5W 750 MG/150ML IV SOLN
750.0000 mg | Freq: Once | INTRAVENOUS | Status: AC
  Administered 2014-03-16: 750 mg via INTRAVENOUS
  Filled 2014-03-16: qty 150

## 2014-03-16 NOTE — ED Notes (Signed)
CAT complete 

## 2014-03-16 NOTE — ED Notes (Signed)
Kohut MD at bedside aware of pt status.
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
## 2014-03-16 NOTE — ED Provider Notes (Signed)
7:29 AM Assume patient care from Dr Ranae Palms in sign out. 38 year-old male presenting with dyspnea and cough. Chest x-ray is consistent with pneumonia. Initiated treatment for community acquired pneumonia. Received additionally steroids and albuterol. Subjectively, reports markedly improved symptoms after hour long neb. He still has some wheezing, but no significantly increased work of breathing. Oxygen saturations are in the high 90s 100% on room air. Feel is appropriate for outpatient treatment.  Dale Razor, MD 03/21/14 [phone]

## 2014-03-16 NOTE — Discharge Instructions (Signed)
Bronchospasm, Adult  A bronchospasm is a spasm or tightening of the airways going into the lungs. During a bronchospasm breathing becomes more difficult because the airways get smaller. When this happens there can be coughing, a whistling sound when breathing (wheezing), and difficulty breathing. Bronchospasm is often associated with asthma, but not all patients who experience a bronchospasm have asthma.  CAUSES   A bronchospasm is caused by inflammation or irritation of the airways. The inflammation or irritation may be triggered by:    Allergies (such as to animals, pollen, food, or mold). Allergens that cause bronchospasm may cause wheezing immediately after exposure or many hours later.    Infection. Viral infections are believed to be the most common cause of bronchospasm.    Exercise.    Irritants (such as pollution, cigarette smoke, strong odors, aerosol sprays, and paint fumes).    Weather changes. Winds increase molds and pollens in the air. Rain refreshes the air by washing irritants out. Cold air may cause inflammation.    Stress and emotional upset.   SIGNS AND SYMPTOMS    Wheezing.    Excessive nighttime coughing.    Frequent or severe coughing with a simple cold.    Chest tightness.    Shortness of breath.   DIAGNOSIS   Bronchospasm is usually diagnosed through a history and physical exam. Tests, such as chest X-rays, are sometimes done to look for other conditions.  TREATMENT    Inhaled medicines can be given to open up your airways and help you breathe. The medicines can be given using either an inhaler or a nebulizer machine.   Corticosteroid medicines may be given for severe bronchospasm, usually when it is associated with asthma.  HOME CARE INSTRUCTIONS    Always have a plan prepared for seeking medical care. Know when to call your health care provider and local emergency services (911 in the [place] Know where you can access local emergency care.   Only take medicines as  directed by your health care provider.   If you were prescribed an inhaler or nebulizer machine, ask your health care provider to explain how to use it correctly. Always use a spacer with your inhaler if you were given one.   It is necessary to remain calm during an attack. Try to relax and breathe more slowly.   Control your home environment in the following ways:    Change your heating and air conditioning filter at least once a month.    Limit your use of fireplaces and wood stoves.   Do not smoke and do not allow smoking in your home.    Avoid exposure to perfumes and fragrances.    Get rid of pests (such as roaches and mice) and their droppings.    Throw away plants if you see mold on them.    Keep your house clean and dust free.    Replace carpet with wood, tile, or vinyl flooring. Carpet can trap dander and dust.    Use allergy-proof pillows, mattress covers, and box spring covers.    Wash bed sheets and blankets every week in hot water and dry them in a dryer.    Use blankets that are made of polyester or cotton.    Wash hands frequently.  SEEK MEDICAL CARE IF:    You have muscle aches.    You have chest pain.    The sputum changes from clear or white to yellow, green, gray, or bloody.    The sputum   you cough up gets thicker.    There are problems that may be related to the medicine you are given, such as a rash, itching, swelling, or trouble breathing.   SEEK IMMEDIATE MEDICAL CARE IF:    You have worsening wheezing and coughing even after taking your prescribed medicines.    You have increased difficulty breathing.    You develop severe chest pain.  MAKE SURE YOU:    Understand these instructions.   Will watch your condition.   Will get help right away if you are not doing well or get worse.  Document Released: 10/07/2003 Document Revised: 06/06/2013 Document Reviewed: 03/26/2013  [employer] Patient Information 2014 [employer]  Pneumonia, Adult  Pneumonia is  an infection of the lungs.   CAUSES  Pneumonia may be caused by bacteria or a virus. Usually, these infections are caused by breathing infectious particles into the lungs (respiratory tract).  SYMPTOMS    Cough.   Fever.   Chest pain.   Increased rate of breathing.   Wheezing.   Mucus production.  DIAGNOSIS   If you have the common symptoms of pneumonia, your caregiver will typically confirm the diagnosis with a chest X-ray. The X-ray will show an abnormality in the lung (pulmonary infiltrate) if you have pneumonia. Other tests of your blood, urine, or sputum may be done to find the specific cause of your pneumonia. Your caregiver may also do tests (blood gases or pulse oximetry) to see how well your lungs are working.  TREATMENT   Some forms of pneumonia may be spread to other people when you cough or sneeze. You may be asked to wear a mask before and during your exam. Pneumonia that is caused by bacteria is treated with antibiotic medicine. Pneumonia that is caused by the influenza virus may be treated with an antiviral medicine. Most other viral infections must run their course. These infections will not respond to antibiotics.   PREVENTION  A pneumococcal shot (vaccine) is available to prevent a common bacterial cause of pneumonia. This is usually suggested for:   People over 65 years old.   Patients on chemotherapy.   People with chronic lung problems, such as bronchitis or emphysema.   People with immune system problems.  If you are over 65 or have a high risk condition, you may receive the pneumococcal vaccine if you have not received it before. In some countries, a routine influenza vaccine is also recommended. This vaccine can help prevent some cases of pneumonia.You may be offered the influenza vaccine as part of your care.  If you smoke, it is time to quit. You may receive instructions on how to stop smoking. Your caregiver can provide medicines and counseling to help you quit.  HOME CARE  INSTRUCTIONS    Cough suppressants may be used if you are losing too much rest. However, coughing protects you by clearing your lungs. You should avoid using cough suppressants if you can.   Your caregiver may have prescribed medicine if he or she thinks your pneumonia is caused by a bacteria or influenza. Finish your medicine even if you start to feel better.   Your caregiver may also prescribe an expectorant. This loosens the mucus to be coughed up.   Only take over-the-counter or prescription medicines for pain, discomfort, or fever as directed by your caregiver.   Do not smoke. Smoking is a common cause of bronchitis and can contribute to pneumonia. If you are a smoker and continue to smoke, your   cough may last several weeks after your pneumonia has cleared.   A cold steam vaporizer or humidifier in your room or home may help loosen mucus.   Coughing is often worse at night. Sleeping in a semi-upright position in a recliner or using a couple pillows under your head will help with this.   Get rest as you feel it is needed. Your body will usually let you know when you need to rest.  SEEK IMMEDIATE MEDICAL CARE IF:    Your illness becomes worse. This is especially true if you are elderly or weakened from any other disease.   You cannot control your cough with suppressants and are losing sleep.   You begin coughing up blood.   You develop pain which is getting worse or is uncontrolled with medicines.   You have a fever.   Any of the symptoms which initially brought you in for treatment are getting worse rather than better.   You develop shortness of breath or chest pain.  MAKE SURE YOU:    Understand these instructions.   Will watch your condition.   Will get help right away if you are not doing well or get worse.  Document Released: 10/04/2005 Document Revised: 12/27/2011 Document Reviewed: 12/24/2010  [employer] Patient Information 2014 [employer]

## 2014-03-16 NOTE — ED Notes (Signed)
Pt on CAT at present time.

## 2014-03-16 NOTE — ED Notes (Signed)
Expiratory wheezing only heard to upper lobes on reassessment post CAT and inhaler. Pt 99% of RA.

## 2014-03-16 NOTE — ED Notes (Signed)
Pt arrived to the ED with a compliant of shortness of breath and a cough.  Pt has had these symptoms for a week.  Pt has been taking over the counter medications.  Pt has a hx of pulmonary embolism in 2012.  Pt also has a history of bronchitis for which he has a prescription for an inhaler

## 2014-05-08 ENCOUNTER — Emergency Department (HOSPITAL_COMMUNITY)
Admission: EM | Admit: 2014-05-08 | Discharge: 2014-05-08 | Disposition: A | Discharge status: Home / Self Care | Payer: BC Managed Care – PPO | Source: Home / Self Care | Attending: Family Medicine | Admitting: Family Medicine

## 2014-05-08 ENCOUNTER — Encounter (HOSPITAL_COMMUNITY): Payer: Self-pay | Admitting: Emergency Medicine

## 2014-05-08 DIAGNOSIS — L03818 Cellulitis of other sites: Secondary | ICD-10-CM

## 2014-05-08 DIAGNOSIS — L02811 Cutaneous abscess of head [any part, except face]: Secondary | ICD-10-CM

## 2014-05-08 DIAGNOSIS — L02818 Cutaneous abscess of other sites: Secondary | ICD-10-CM

## 2014-05-08 MED ORDER — MINOCYCLINE HCL 100 MG PO CAPS: 100.0000 mg | ORAL_CAPSULE | Freq: Two times a day (BID) | ORAL | Status: DC

## 2014-05-08 NOTE — Discharge Instructions (Signed)
Warm compress twice a day when you take the antibiotic, take all of medicine, return as needed. °
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
## 2014-05-12 LAB — CULTURE, ROUTINE-ABSCESS

## 2014-05-15 NOTE — ED Notes (Signed)
Abscess scalp: Mod. diphtheroids ( Corynebacterium species).  Pt. adequately treated with Minocycline.  Lab shown to Dr. Artis FlockKindl and he said it is "OK -normal skin bacteria." [name], Nicolemarie Wooley M 05/15/2014

## 2015-01-30 ENCOUNTER — Emergency Department (INDEPENDENT_AMBULATORY_CARE_PROVIDER_SITE_OTHER)
Admission: EM | Admit: 2015-01-30 | Discharge: 2015-01-30 | Disposition: A | Discharge status: Home / Self Care | Payer: BLUE CROSS/BLUE SHIELD | Source: Home / Self Care | Attending: Family Medicine | Admitting: Family Medicine

## 2015-01-30 ENCOUNTER — Encounter (HOSPITAL_COMMUNITY): Payer: Self-pay | Admitting: *Deleted

## 2015-01-30 DIAGNOSIS — L732 Hidradenitis suppurativa: Secondary | ICD-10-CM

## 2015-01-30 DIAGNOSIS — L02411 Cutaneous abscess of right axilla: Secondary | ICD-10-CM

## 2015-01-30 MED ORDER — HYDROCODONE-ACETAMINOPHEN 5-325 MG PO TABS: 1.0000 | ORAL_TABLET | ORAL | Status: DC | PRN

## 2015-01-30 MED ORDER — CLINDAMYCIN HCL 300 MG PO CAPS: 300.0000 mg | ORAL_CAPSULE | Freq: Three times a day (TID) | ORAL | Status: DC
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
## 2015-01-30 NOTE — Discharge Instructions (Signed)
Abscess An abscess (boil or furuncle) is an infected area on or under the skin. This area is filled with yellowish-white fluid (pus) and other material (debris). HOME CARE   Only take medicines as told by your doctor.  If you were given antibiotic medicine, take it as directed. Finish the medicine even if you start to feel better.  If gauze is used, follow your doctor's directions for changing the gauze.  To avoid spreading the infection:  Keep your abscess covered with a bandage.  Wash your hands well.  Do not share personal care items, towels, or whirlpools with others.  Avoid skin contact with others.  Keep your skin and clothes clean around the abscess.  Keep all doctor visits as told. GET HELP RIGHT AWAY IF:   You have more pain, puffiness (swelling), or redness in the wound site.  You have more fluid or blood coming from the wound site.  You have muscle aches, chills, or you feel sick.  You have a fever. MAKE SURE YOU:   Understand these instructions.  Will watch your condition.  Will get help right away if you are not doing well or get worse. Document Released: 03/22/2008 Document Revised: 04/04/2012 Document Reviewed: 12/17/2011 [facility] Patient Information 2015 [place], [place]. This information is not intended to replace advice given to you by your health care provider. Make sure you discuss any questions you have with your health care provider.  Hidradenitis Suppurativa, Sweat Gland Abscess Hidradenitis suppurativa is a long lasting (chronic), uncommon disease of the sweat glands. With this, boil-like lumps and scarring develop in the groin, some times under the arms (axillae), and under the breasts. It may also uncommonly occur behind the ears, in the crease of the buttocks, and around the genitals.  CAUSES  The cause is from a blocking of the sweat glands. They then become infected. It may cause drainage and odor. It is not contagious. So it cannot be given to  someone else. It most often shows up in puberty (about 6910 to 38 years of age). But it may happen much later. It is similar to acne which is a disease of the sweat glands. This condition is slightly more common in African-Americans and women. SYMPTOMS   Hidradenitis usually starts as one or more red, tender, swellings in the groin or under the arms (axilla).  Over a period of hours to days the lesions get larger. They often open to the skin surface, draining clear to yellow-colored fluid.  The infected area heals with scarring. DIAGNOSIS  Your caregiver makes this diagnosis by looking at you. Sometimes cultures (growing germs on plates in the lab) may be taken. This is to see what germ (bacterium) is causing the infection.  TREATMENT   Topical germ killing medicine applied to the skin (antibiotics) are the treatment of choice. Antibiotics taken by mouth (systemic) are sometimes needed when the condition is getting worse or is severe.  Avoid tight-fitting clothing which traps moisture in.  Dirt does not cause hidradenitis and it is not caused by poor hygiene.  Involved areas should be cleaned daily using an antibacterial soap. Some patients find that the liquid form of Lever 2000, applied to the involved areas as a lotion after bathing, can help reduce the odor related to this condition.  Sometimes surgery is needed to drain infected areas or remove scarred tissue. Removal of large amounts of tissue is used only in severe cases.  Birth control pills may be helpful.  Oral retinoids (vitamin  A derivatives) for 6 to 12 months which are effective for acne may also help this condition.  Weight loss will improve but not cure hidradenitis. It is made worse by being overweight. But the condition is not caused by being overweight.  This condition is more common in people who have had acne.  It may become worse under stress. There is no medical cure for hidradenitis. It can be controlled, but not  cured. The condition usually continues for years with periods of getting worse and getting better (remission). Document Released: 05/18/2004 Document Revised: 12/27/2011 Document Reviewed: 01/04/2014 [facility] Patient Information 2015 [place], [place]. This information is not intended to replace advice given to you by your health care provider. Make sure you discuss any questions you have with your health care provider.
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
## 2015-02-02 ENCOUNTER — Emergency Department (HOSPITAL_COMMUNITY)
Admission: EM | Admit: 2015-02-02 | Discharge: 2015-02-02 | Disposition: A | Discharge status: Home / Self Care | Payer: BLUE CROSS/BLUE SHIELD | Attending: Emergency Medicine | Admitting: Emergency Medicine

## 2015-02-02 ENCOUNTER — Encounter (HOSPITAL_COMMUNITY): Payer: Self-pay | Admitting: Emergency Medicine

## 2015-02-02 DIAGNOSIS — L02419 Cutaneous abscess of limb, unspecified: Secondary | ICD-10-CM

## 2015-02-02 DIAGNOSIS — L02411 Cutaneous abscess of right axilla: Secondary | ICD-10-CM | POA: Insufficient documentation

## 2015-02-02 DIAGNOSIS — Z862 Personal history of diseases of the blood and blood-forming organs and certain disorders involving the immune mechanism: Secondary | ICD-10-CM | POA: Diagnosis not present

## 2015-02-02 DIAGNOSIS — Z86711 Personal history of pulmonary embolism: Secondary | ICD-10-CM | POA: Insufficient documentation

## 2015-02-02 DIAGNOSIS — Z8679 Personal history of other diseases of the circulatory system: Secondary | ICD-10-CM | POA: Insufficient documentation

## 2015-02-02 DIAGNOSIS — Z79899 Other long term (current) drug therapy: Secondary | ICD-10-CM | POA: Insufficient documentation

## 2015-02-02 DIAGNOSIS — Z7901 Long term (current) use of anticoagulants: Secondary | ICD-10-CM | POA: Insufficient documentation

## 2015-02-02 DIAGNOSIS — Z87828 Personal history of other (healed) physical injury and trauma: Secondary | ICD-10-CM | POA: Diagnosis not present

## 2015-02-02 DIAGNOSIS — L732 Hidradenitis suppurativa: Secondary | ICD-10-CM

## 2015-02-02 DIAGNOSIS — Z720 Tobacco use: Secondary | ICD-10-CM | POA: Diagnosis not present

## 2015-02-02 DIAGNOSIS — Z4801 Encounter for change or removal of surgical wound dressing: Secondary | ICD-10-CM | POA: Diagnosis present

## 2015-02-02 MED ORDER — MUPIROCIN CALCIUM 2 % EX CREA: 1.0000 "application " | TOPICAL_CREAM | Freq: Two times a day (BID) | CUTANEOUS | Status: DC

## 2015-02-02 MED ORDER — SODIUM BICARBONATE 4 % IV SOLN
5.0000 mL | Freq: Once | INTRAVENOUS | Status: AC
  Administered 2015-02-02: 5 mL via SUBCUTANEOUS
  Filled 2015-02-02: qty 5

## 2015-02-02 MED ORDER — LIDOCAINE HCL (PF) 1 % IJ SOLN
10.0000 mL | Freq: Once | INTRAMUSCULAR | Status: AC
  Administered 2015-02-02: 10 mL
  Filled 2015-02-02: qty 10

## 2015-02-02 NOTE — Discharge Instructions (Signed)
Hidradenitis Suppurativa, Sweat Gland Abscess Hidradenitis suppurativa is a long lasting (chronic), uncommon disease of the sweat glands. With this, boil-like lumps and scarring develop in the groin, some times under the arms (axillae), and under the breasts. It may also uncommonly occur behind the ears, in the crease of the buttocks, and around the genitals.  CAUSES  The cause is from a blocking of the sweat glands. They then become infected. It may cause drainage and odor. It is not contagious. So it cannot be given to someone else. It most often shows up in puberty (about 10 to 38 years of age). But it may happen much later. It is similar to acne which is a disease of the sweat glands. This condition is slightly more common in African-Americans and women. SYMPTOMS   Hidradenitis usually starts as one or more red, tender, swellings in the groin or under the arms (axilla).  Over a period of hours to days the lesions get larger. They often open to the skin surface, draining clear to yellow-colored fluid.  The infected area heals with scarring. DIAGNOSIS  Your caregiver makes this diagnosis by looking at you. Sometimes cultures (growing germs on plates in the lab) may be taken. This is to see what germ (bacterium) is causing the infection.  TREATMENT   Topical germ killing medicine applied to the skin (antibiotics) are the treatment of choice. Antibiotics taken by mouth (systemic) are sometimes needed when the condition is getting worse or is severe.  Avoid tight-fitting clothing which traps moisture in.  Dirt does not cause hidradenitis and it is not caused by poor hygiene.  Involved areas should be cleaned daily using an antibacterial soap. Some patients find that the liquid form of Lever 2000, applied to the involved areas as a lotion after bathing, can help reduce the odor related to this condition.  Sometimes surgery is needed to drain infected areas or remove scarred tissue. Removal of  large amounts of tissue is used only in severe cases.  Birth control pills may be helpful.  Oral retinoids (vitamin A derivatives) for 6 to 12 months which are effective for acne may also help this condition.  Weight loss will improve but not cure hidradenitis. It is made worse by being overweight. But the condition is not caused by being overweight.  This condition is more common in people who have had acne.  It may become worse under stress. There is no medical cure for hidradenitis. It can be controlled, but not cured. The condition usually continues for years with periods of getting worse and getting better (remission). Document Released: 05/18/2004 Document Revised: 12/27/2011 Document Reviewed: 01/04/2014 Exit[employer] Patient Information 2015 [employer]. This information is not intended to replace advice given to you by your health care provider. Make sure you discuss any questions you have with your health care provider.  
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
## 2015-02-02 NOTE — ED Notes (Signed)
Told pt I needed to place an ace wrap over shoulders and across dressing to apply pressure to wound d/t use of blood thinner. Pt refused ace wrap, stating he thought he didn't need it and stated he would check on it when he got home later. No obvious bleeding to dressing.

## 2015-02-02 NOTE — ED Notes (Signed)
Came to ER last week for abscess drainage. States he believes it's been getting worse since the drainage.

## 2015-02-02 NOTE — ED Notes (Signed)
Final report of lab findings available for review. Given to D Mabe NP to review

## 2015-02-03 LAB — CULTURE, ROUTINE-ABSCESS: Special Requests: NORMAL

## 2015-02-03 NOTE — ED Notes (Addendum)
Abscess culture R axilla: Few Proteus Mirabilis and Mod. Group B strep (S. Agalactiae).  Treated with I and D and Cleocin.  Message sent to Hayden Rasmussenavid Mabe NP. [name], Lastacia Solum M 02/03/2015 [name]. 02/04/2015

## 2015-02-15 ENCOUNTER — Emergency Department (HOSPITAL_COMMUNITY)
Admission: EM | Admit: 2015-02-15 | Discharge: 2015-02-16 | Disposition: A | Discharge status: Home / Self Care | Payer: BLUE CROSS/BLUE SHIELD | Attending: Emergency Medicine | Admitting: Emergency Medicine

## 2015-02-15 DIAGNOSIS — Z86711 Personal history of pulmonary embolism: Secondary | ICD-10-CM | POA: Insufficient documentation

## 2015-02-15 DIAGNOSIS — Z8679 Personal history of other diseases of the circulatory system: Secondary | ICD-10-CM | POA: Diagnosis not present

## 2015-02-15 DIAGNOSIS — Z4801 Encounter for change or removal of surgical wound dressing: Secondary | ICD-10-CM | POA: Diagnosis present

## 2015-02-15 DIAGNOSIS — Z79899 Other long term (current) drug therapy: Secondary | ICD-10-CM | POA: Diagnosis not present

## 2015-02-15 DIAGNOSIS — L02413 Cutaneous abscess of right upper limb: Secondary | ICD-10-CM | POA: Insufficient documentation

## 2015-02-15 DIAGNOSIS — Z862 Personal history of diseases of the blood and blood-forming organs and certain disorders involving the immune mechanism: Secondary | ICD-10-CM | POA: Diagnosis not present

## 2015-02-15 DIAGNOSIS — Z720 Tobacco use: Secondary | ICD-10-CM | POA: Diagnosis not present

## 2015-02-15 DIAGNOSIS — L732 Hidradenitis suppurativa: Secondary | ICD-10-CM | POA: Diagnosis not present

## 2015-02-15 DIAGNOSIS — Z87828 Personal history of other (healed) physical injury and trauma: Secondary | ICD-10-CM | POA: Insufficient documentation

## 2015-02-15 DIAGNOSIS — Z792 Long term (current) use of antibiotics: Secondary | ICD-10-CM | POA: Insufficient documentation

## 2015-02-15 DIAGNOSIS — L0291 Cutaneous abscess, unspecified: Secondary | ICD-10-CM

## 2015-02-15 DIAGNOSIS — Z7901 Long term (current) use of anticoagulants: Secondary | ICD-10-CM | POA: Insufficient documentation

## 2015-02-15 NOTE — ED Notes (Signed)
Pt presents with c/o wound check on his right shoulder. Pt reports that he had an abscess drained in his right armpit approx 3 weeks ago and then again 3 days later. Pt reports that the pain and swelling to that area has gotten worse. Pt is also c/o right knee weakness. Reports he feels like "his knee is about to give out". Ambulatory to triage.

## 2015-02-16 ENCOUNTER — Encounter (HOSPITAL_COMMUNITY): Payer: Self-pay

## 2015-02-16 MED ORDER — SULFAMETHOXAZOLE-TRIMETHOPRIM 800-160 MG PO TABS
1.0000 | ORAL_TABLET | Freq: Once | ORAL | Status: AC
  Administered 2015-02-16: 1 via ORAL
  Filled 2015-02-16: qty 1

## 2015-02-16 MED ORDER — HYDROMORPHONE HCL 1 MG/ML IJ SOLN
1.0000 mg | Freq: Once | INTRAMUSCULAR | Status: AC
  Administered 2015-02-16: 1 mg via INTRAMUSCULAR
  Filled 2015-02-16: qty 1

## 2015-02-16 MED ORDER — HYDROCODONE-ACETAMINOPHEN 5-325 MG PO TABS: 2.0000 | ORAL_TABLET | ORAL | Status: DC | PRN

## 2015-02-16 MED ORDER — ONDANSETRON 4 MG PO TBDP
4.0000 mg | ORAL_TABLET | Freq: Once | ORAL | Status: AC
  Administered 2015-02-16: 4 mg via ORAL
  Filled 2015-02-16: qty 1

## 2015-02-16 MED ORDER — LIDOCAINE-EPINEPHRINE 1 %-1:100000 IJ SOLN
10.0000 mL | Freq: Once | INTRAMUSCULAR | Status: AC
  Administered 2015-02-16: 10 mL
  Filled 2015-02-16: qty 1

## 2015-02-16 MED ORDER — SULFAMETHOXAZOLE-TRIMETHOPRIM 800-160 MG PO TABS: 1.0000 | ORAL_TABLET | Freq: Two times a day (BID) | ORAL | Status: AC
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
## 2015-02-16 NOTE — Discharge Instructions (Signed)
Abscess °Care After °An abscess (also called a boil or furuncle) is an infected area that contains a collection of pus. Signs and symptoms of an abscess include pain, tenderness, redness, or hardness, or you may feel a moveable soft area under your skin. An abscess can occur anywhere in the body. The infection may spread to surrounding tissues causing cellulitis. A cut (incision) by the surgeon was made over your abscess and the pus was drained out. Gauze may have been packed into the space to provide a drain that will allow the cavity to heal from the inside outwards. The boil may be painful for 5 to 7 days. Most people with a boil do not have high fevers. Your abscess, if seen early, may not have localized, and may not have been lanced. If not, another appointment may be required for this if it does not get better on its own or with medications. °HOME CARE INSTRUCTIONS  °· Only take over-the-counter or prescription medicines for pain, discomfort, or fever as directed by your caregiver. °· When you bathe, soak and then remove gauze or iodoform packs at least daily or as directed by your caregiver. You may then wash the wound gently with mild soapy water. Repack with gauze or do as your caregiver directs. °SEEK IMMEDIATE MEDICAL CARE IF:  °· You develop increased pain, swelling, redness, drainage, or bleeding in the wound site. °· You develop signs of generalized infection including muscle aches, chills, fever, or a general ill feeling. °· An oral temperature above 102° F (38.9° C) develops, not controlled by medication. °See your caregiver for a recheck if you develop any of the symptoms described above. If medications (antibiotics) were prescribed, take them as directed. °Document Released: 04/22/2005 Document Revised: 12/27/2011 Document Reviewed: 12/18/2007 °ExitCare® Patient Information ©2015 [employer]. This information is not intended to replace advice given to you by your health care provider. Make sure  you discuss any questions you have with your health care provider. ° °Cellulitis °Cellulitis is an infection of the skin and the tissue beneath it. The infected area is usually red and tender. Cellulitis occurs most often in the arms and lower legs.  °CAUSES  °Cellulitis is caused by bacteria that enter the skin through cracks or cuts in the skin. The most common types of bacteria that cause cellulitis are staphylococci and streptococci. °SIGNS AND SYMPTOMS  °· Redness and warmth. °· Swelling. °· Tenderness or pain. °· Fever. °DIAGNOSIS  °Your health care provider can usually determine what is wrong based on a physical exam. Blood tests may also be done. °TREATMENT  °Treatment usually involves taking an antibiotic medicine. °HOME CARE INSTRUCTIONS  °· Take your antibiotic medicine as directed by your health care provider. Finish the antibiotic even if you start to feel better. °· Keep the infected arm or leg elevated to reduce swelling. °· Apply a warm cloth to the affected area up to 4 times per day to relieve pain. °· Take medicines only as directed by your health care provider. °· Keep all follow-up visits as directed by your health care provider. °SEEK MEDICAL CARE IF:  °· You notice red streaks coming from the infected area. °· Your red area gets larger or turns dark in color. °· Your bone or joint underneath the infected area becomes painful after the skin has healed. °· Your infection returns in the same area or another area. °· You notice a swollen bump in the infected area. °· You develop new symptoms. °· You have   a fever. °SEEK IMMEDIATE MEDICAL CARE IF:  °· You feel very sleepy. °· You develop vomiting or diarrhea. °· You have a general ill feeling (malaise) with muscle aches and pains. °MAKE SURE YOU:  °· Understand these instructions. °· Will watch your condition. °· Will get help right away if you are not doing well or get worse. °Document Released: 07/14/2005 Document Revised: 02/18/2014 Document  Reviewed: 12/20/2011 °ExitCare® Patient Information ©2015 [employer]. This information is not intended to replace advice given to you by your health care provider. Make sure you discuss any questions you have with your health care provider. ° °

## 2015-04-13 ENCOUNTER — Encounter (HOSPITAL_COMMUNITY): Payer: Self-pay | Admitting: Emergency Medicine

## 2015-04-13 ENCOUNTER — Emergency Department (HOSPITAL_COMMUNITY)
Admission: EM | Admit: 2015-04-13 | Discharge: 2015-04-13 | Disposition: A | Discharge status: Home / Self Care | Payer: BLUE CROSS/BLUE SHIELD | Attending: Emergency Medicine | Admitting: Emergency Medicine

## 2015-04-13 DIAGNOSIS — M2550 Pain in unspecified joint: Secondary | ICD-10-CM | POA: Insufficient documentation

## 2015-04-13 DIAGNOSIS — Z79899 Other long term (current) drug therapy: Secondary | ICD-10-CM | POA: Diagnosis not present

## 2015-04-13 DIAGNOSIS — Z86711 Personal history of pulmonary embolism: Secondary | ICD-10-CM | POA: Diagnosis not present

## 2015-04-13 DIAGNOSIS — Z7982 Long term (current) use of aspirin: Secondary | ICD-10-CM | POA: Diagnosis not present

## 2015-04-13 DIAGNOSIS — M542 Cervicalgia: Secondary | ICD-10-CM | POA: Insufficient documentation

## 2015-04-13 DIAGNOSIS — Z720 Tobacco use: Secondary | ICD-10-CM | POA: Diagnosis not present

## 2015-04-13 DIAGNOSIS — Z862 Personal history of diseases of the blood and blood-forming organs and certain disorders involving the immune mechanism: Secondary | ICD-10-CM | POA: Diagnosis not present

## 2015-04-13 DIAGNOSIS — Z87828 Personal history of other (healed) physical injury and trauma: Secondary | ICD-10-CM | POA: Insufficient documentation

## 2015-04-13 MED ORDER — HYDROCODONE-ACETAMINOPHEN 5-325 MG PO TABS: 2.0000 | ORAL_TABLET | ORAL | Status: DC | PRN

## 2015-04-13 MED ORDER — CYCLOBENZAPRINE HCL 10 MG PO TABS
5.0000 mg | ORAL_TABLET | Freq: Once | ORAL | Status: AC
  Administered 2015-04-13: 5 mg via ORAL
  Filled 2015-04-13: qty 1

## 2015-04-13 MED ORDER — PREDNISONE 10 MG (21) PO TBPK: 10.0000 mg | ORAL_TABLET | Freq: Every day | ORAL | Status: DC

## 2015-04-13 MED ORDER — HYDROCODONE-ACETAMINOPHEN 5-325 MG PO TABS
2.0000 | ORAL_TABLET | Freq: Once | ORAL | Status: AC
  Administered 2015-04-13: 2 via ORAL
  Filled 2015-04-13: qty 2

## 2015-04-13 MED ORDER — METHOCARBAMOL 500 MG PO TABS: 500.0000 mg | ORAL_TABLET | Freq: Two times a day (BID) | ORAL | Status: DC

## 2015-04-13 NOTE — ED Notes (Signed)
Patient c/o neck and right shoulder pain, states it's a tingling intermittent pain. Patient denies injuries. Patient c/o weakness to right arm that is also intermittent. Grips strong and equal.
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
## 2015-04-13 NOTE — Discharge Instructions (Signed)
Musculoskeletal Pain Take prednisone as prescribed. I have also prescribed muscle relaxers and Norco for breakthrough pain. Return for any weakness, numbness in the upper extremities. Musculoskeletal pain is muscle and boney aches and pains. These pains can occur in any part of the body. Your caregiver may treat you without knowing the cause of the pain. They may treat you if blood or urine tests, X-rays, and other tests were normal.  CAUSES There is often not a definite cause or reason for these pains. These pains may be caused by a type of germ (virus). The discomfort may also come from overuse. Overuse includes working out too hard when your body is not fit. Boney aches also come from weather changes. Bone is sensitive to atmospheric pressure changes. HOME CARE INSTRUCTIONS   Ask when your test results will be ready. Make sure you get your test results.  Only take over-the-counter or prescription medicines for pain, discomfort, or fever as directed by your caregiver. If you were given medications for your condition, do not drive, operate machinery or power tools, or sign legal documents for 24 hours. Do not drink alcohol. Do not take sleeping pills or other medications that may interfere with treatment.  Continue all activities unless the activities cause more pain. When the pain lessens, slowly resume normal activities. Gradually increase the intensity and duration of the activities or exercise.  During periods of severe pain, bed rest may be helpful. Lay or sit in any position that is comfortable.  Putting ice on the injured area.  Put ice in a bag.  Place a towel between your skin and the bag.  Leave the ice on for 15 to 20 minutes, 3 to 4 times a day.  Follow up with your caregiver for continued problems and no reason can be found for the pain. If the pain becomes worse or does not go away, it may be necessary to repeat tests or do additional testing. Your caregiver may need to look  further for a possible cause. SEEK IMMEDIATE MEDICAL CARE IF:  You have pain that is getting worse and is not relieved by medications.  You develop chest pain that is associated with shortness or breath, sweating, feeling sick to your stomach (nauseous), or throw up (vomit).  Your pain becomes localized to the abdomen.  You develop any new symptoms that seem different or that concern you. MAKE SURE YOU:   Understand these instructions.  Will watch your condition.  Will get help right away if you are not doing well or get worse. Document Released: 10/04/2005 Document Revised: 12/27/2011 Document Reviewed: 06/08/2013 [facility] Patient Information 2015 [place], [place]. This information is not intended to replace advice given to you by your health care provider. Make sure you discuss any questions you have with your health care provider.

## 2015-04-26 ENCOUNTER — Emergency Department (HOSPITAL_COMMUNITY)
Admission: EM | Admit: 2015-04-26 | Discharge: 2015-04-26 | Disposition: A | Discharge status: Home / Self Care | Payer: BLUE CROSS/BLUE SHIELD | Source: Home / Self Care | Attending: Family Medicine | Admitting: Family Medicine

## 2015-04-26 ENCOUNTER — Encounter (HOSPITAL_COMMUNITY): Payer: Self-pay | Admitting: *Deleted

## 2015-04-26 ENCOUNTER — Emergency Department (INDEPENDENT_AMBULATORY_CARE_PROVIDER_SITE_OTHER): Payer: BLUE CROSS/BLUE SHIELD

## 2015-04-26 DIAGNOSIS — M5090 Cervical disc disorder, unspecified, unspecified cervical region: Secondary | ICD-10-CM

## 2015-04-26 MED ORDER — METHYLPREDNISOLONE ACETATE 40 MG/ML IJ SUSP
80.0000 mg | Freq: Once | INTRAMUSCULAR | Status: AC
  Administered 2015-04-26: 80 mg via INTRAMUSCULAR

## 2015-04-26 MED ORDER — METHYLPREDNISOLONE ACETATE 80 MG/ML IJ SUSP
INTRAMUSCULAR | Status: AC
  Filled 2015-04-26: qty 1

## 2015-04-26 MED ORDER — METHYLPREDNISOLONE 4 MG PO TBPK: ORAL_TABLET | ORAL | Status: DC
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
## 2015-04-26 NOTE — Discharge Instructions (Signed)
Use medicine as prescribed and see specialist if further problems. °

## 2015-04-26 NOTE — ED Notes (Signed)
Pt  Reports     Neck        And  Upper  Back pain  X  1  Month  Pain  Radiates down the  r  Arm   denys  Any  specefic  Injury  Was   Seen in the  Er  Several  Weeks  Ago

## 2015-07-04 ENCOUNTER — Emergency Department (HOSPITAL_COMMUNITY): Payer: BLUE CROSS/BLUE SHIELD

## 2015-07-04 ENCOUNTER — Observation Stay (HOSPITAL_COMMUNITY)
Admission: EM | Admit: 2015-07-04 | Discharge: 2015-07-07 | Disposition: A | Discharge status: Home / Self Care | Payer: BLUE CROSS/BLUE SHIELD | Attending: Family Medicine | Admitting: Family Medicine

## 2015-07-04 DIAGNOSIS — K922 Gastrointestinal hemorrhage, unspecified: Secondary | ICD-10-CM | POA: Insufficient documentation

## 2015-07-04 DIAGNOSIS — T45515A Adverse effect of anticoagulants, initial encounter: Secondary | ICD-10-CM | POA: Insufficient documentation

## 2015-07-04 DIAGNOSIS — Z86711 Personal history of pulmonary embolism: Secondary | ICD-10-CM

## 2015-07-04 DIAGNOSIS — D649 Anemia, unspecified: Secondary | ICD-10-CM | POA: Insufficient documentation

## 2015-07-04 DIAGNOSIS — R0602 Shortness of breath: Secondary | ICD-10-CM | POA: Diagnosis present

## 2015-07-04 DIAGNOSIS — R791 Abnormal coagulation profile: Principal | ICD-10-CM

## 2015-07-04 DIAGNOSIS — Z7901 Long term (current) use of anticoagulants: Secondary | ICD-10-CM | POA: Insufficient documentation

## 2015-07-04 DIAGNOSIS — S6991XA Unspecified injury of right wrist, hand and finger(s), initial encounter: Secondary | ICD-10-CM

## 2015-07-04 DIAGNOSIS — E860 Dehydration: Secondary | ICD-10-CM

## 2015-07-04 DIAGNOSIS — W19XXXA Unspecified fall, initial encounter: Secondary | ICD-10-CM | POA: Diagnosis not present

## 2015-07-04 DIAGNOSIS — R739 Hyperglycemia, unspecified: Secondary | ICD-10-CM | POA: Insufficient documentation

## 2015-07-04 DIAGNOSIS — K921 Melena: Secondary | ICD-10-CM | POA: Diagnosis present

## 2015-07-04 DIAGNOSIS — F17210 Nicotine dependence, cigarettes, uncomplicated: Secondary | ICD-10-CM | POA: Insufficient documentation

## 2015-07-04 DIAGNOSIS — S92351A Displaced fracture of fifth metatarsal bone, right foot, initial encounter for closed fracture: Secondary | ICD-10-CM | POA: Insufficient documentation

## 2015-07-04 DIAGNOSIS — D6489 Other specified anemias: Secondary | ICD-10-CM

## 2015-07-04 DIAGNOSIS — Z9119 Patient's noncompliance with other medical treatment and regimen: Secondary | ICD-10-CM | POA: Diagnosis not present

## 2015-07-04 LAB — CBC WITH DIFFERENTIAL/PLATELET
BASOS ABS: 0 10*3/uL (ref 0.0–0.1)
BASOS PCT: 0 %
EOS ABS: 0 10*3/uL (ref 0.0–0.7)
EOS PCT: 0 %
HCT: 26.2 % — ABNORMAL LOW (ref 39.0–52.0)
Hemoglobin: 8.9 g/dL — ABNORMAL LOW (ref 13.0–17.0)
LYMPHS PCT: 26 %
Lymphs Abs: 2.8 10*3/uL (ref 0.7–4.0)
MCH: 32.6 pg (ref 26.0–34.0)
MCHC: 34 g/dL (ref 30.0–36.0)
MCV: 96 fL (ref 78.0–100.0)
MONO ABS: 1 10*3/uL (ref 0.1–1.0)
Monocytes Relative: 9 %
Neutro Abs: 7 10*3/uL (ref 1.7–7.7)
Neutrophils Relative %: 65 %
PLATELETS: 239 10*3/uL (ref 150–400)
RBC: 2.73 MIL/uL — AB (ref 4.22–5.81)
RDW: 12.8 % (ref 11.5–15.5)
WBC: 10.8 10*3/uL — AB (ref 4.0–10.5)

## 2015-07-04 LAB — COMPREHENSIVE METABOLIC PANEL
ALT: 12 U/L — AB (ref 17–63)
AST: 20 U/L (ref 15–41)
Albumin: 2.4 g/dL — ABNORMAL LOW (ref 3.5–5.0)
Alkaline Phosphatase: 48 U/L (ref 38–126)
Anion gap: 7 (ref 5–15)
BUN: 27 mg/dL — AB (ref 6–20)
CHLORIDE: 104 mmol/L (ref 101–111)
CO2: 25 mmol/L (ref 22–32)
CREATININE: 1.22 mg/dL (ref 0.61–1.24)
Calcium: 8.4 mg/dL — ABNORMAL LOW (ref 8.9–10.3)
GFR calc Af Amer: >60 mL/min (ref >60)
Glucose, Bld: 133 mg/dL — ABNORMAL HIGH (ref 65–99)
POTASSIUM: 3.7 mmol/L (ref 3.5–5.1)
SODIUM: 136 mmol/L (ref 135–145)
Total Bilirubin: 0.4 mg/dL (ref 0.3–1.2)
Total Protein: 5.4 g/dL — ABNORMAL LOW (ref 6.5–8.1)

## 2015-07-04 LAB — ABO/RH: ABO/RH(D): O POS

## 2015-07-04 LAB — PROTIME-INR
INR: 8.48 — AB (ref 0.00–1.49)
PROTHROMBIN TIME: 66.9 s — AB (ref 11.6–15.2)

## 2015-07-04 LAB — I-STAT TROPONIN, ED: TROPONIN I, POC: 0.01 ng/mL (ref 0.00–0.08)

## 2015-07-04 LAB — MRSA PCR SCREENING: MRSA BY PCR: NEGATIVE

## 2015-07-04 MED ORDER — SODIUM CHLORIDE 0.9 % IV BOLUS (SEPSIS)
500.0000 mL | Freq: Once | INTRAVENOUS | Status: AC
  Administered 2015-07-04: 500 mL via INTRAVENOUS

## 2015-07-04 MED ORDER — SODIUM CHLORIDE 0.9 % IJ SOLN
3.0000 mL | Freq: Two times a day (BID) | INTRAMUSCULAR | Status: DC
  Administered 2015-07-04 – 2015-07-06 (×3): 3 mL via INTRAVENOUS

## 2015-07-04 MED ORDER — SODIUM CHLORIDE 0.9 % IV SOLN: 250.0000 mL | INTRAVENOUS | Status: DC | PRN

## 2015-07-04 MED ORDER — ALBUTEROL SULFATE (2.5 MG/3ML) 0.083% IN NEBU: 3.0000 mL | INHALATION_SOLUTION | Freq: Four times a day (QID) | RESPIRATORY_TRACT | Status: DC | PRN

## 2015-07-04 MED ORDER — SODIUM CHLORIDE 0.9 % IJ SOLN: 3.0000 mL | INTRAMUSCULAR | Status: DC | PRN

## 2015-07-04 MED ORDER — VITAMIN K1 10 MG/ML IJ SOLN
10.0000 mg | Freq: Once | INTRAVENOUS | Status: AC
  Administered 2015-07-04: 10 mg via INTRAVENOUS
  Filled 2015-07-04: qty 1

## 2015-07-04 MED ORDER — IOHEXOL 350 MG/ML SOLN
100.0000 mL | Freq: Once | INTRAVENOUS | Status: AC | PRN
  Administered 2015-07-04: 100 mL via INTRAVENOUS
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
## 2015-07-04 NOTE — ED Notes (Addendum)
Paramedic Reports that the pt. he woke up with sob on exertion and dizziness, approximately at 600 am.  Also feels like his chest is pounding.  Denies any n/v.  Pt. Having stress over recent divorce and home renovations.  Pt. Has a hx of PE and is on Plavix.  Pt.s cbg 262, no hx of diabetes, After speaking with pt. He is on Coumadin.  Marland Kitchen

## 2015-07-05 ENCOUNTER — Encounter (HOSPITAL_COMMUNITY): Payer: Self-pay | Admitting: *Deleted

## 2015-07-05 ENCOUNTER — Observation Stay (HOSPITAL_COMMUNITY): Payer: BLUE CROSS/BLUE SHIELD

## 2015-07-05 DIAGNOSIS — R791 Abnormal coagulation profile: Secondary | ICD-10-CM | POA: Diagnosis not present

## 2015-07-05 LAB — BASIC METABOLIC PANEL
Anion gap: 3 — ABNORMAL LOW (ref 5–15)
BUN: 26 mg/dL — AB (ref 6–20)
CHLORIDE: 107 mmol/L (ref 101–111)
CO2: 26 mmol/L (ref 22–32)
CREATININE: 1.03 mg/dL (ref 0.61–1.24)
Calcium: 7.4 mg/dL — ABNORMAL LOW (ref 8.9–10.3)
GFR calc non Af Amer: >60 mL/min (ref >60)
GLUCOSE: 138 mg/dL — AB (ref 65–99)
Potassium: 3.9 mmol/L (ref 3.5–5.1)
Sodium: 136 mmol/L (ref 135–145)

## 2015-07-05 LAB — CBC
HCT: 15.8 % — ABNORMAL LOW (ref 39.0–52.0)
Hemoglobin: 5.5 g/dL — CL (ref 13.0–17.0)
MCH: 33.1 pg (ref 26.0–34.0)
MCHC: 34.8 g/dL (ref 30.0–36.0)
MCV: 95.2 fL (ref 78.0–100.0)
PLATELETS: 169 10*3/uL (ref 150–400)
RBC: 1.66 MIL/uL — ABNORMAL LOW (ref 4.22–5.81)
RDW: 12.5 % (ref 11.5–15.5)
WBC: 9.1 10*3/uL (ref 4.0–10.5)

## 2015-07-05 LAB — HEMOGLOBIN A1C
Hgb A1c MFr Bld: 6.9 % — ABNORMAL HIGH (ref 4.8–5.6)
Mean Plasma Glucose: 151 mg/dL

## 2015-07-05 LAB — HEMOGLOBIN AND HEMATOCRIT, BLOOD
HCT: 22.5 % — ABNORMAL LOW (ref 39.0–52.0)
HEMOGLOBIN: 8 g/dL — AB (ref 13.0–17.0)

## 2015-07-05 LAB — PROTIME-INR
INR: 1.61 — AB (ref 0.00–1.49)
Prothrombin Time: 19.2 seconds — ABNORMAL HIGH (ref 11.6–15.2)

## 2015-07-05 LAB — PREPARE RBC (CROSSMATCH)

## 2015-07-05 MED ORDER — TRAMADOL HCL 50 MG PO TABS
50.0000 mg | ORAL_TABLET | Freq: Four times a day (QID) | ORAL | Status: DC | PRN
  Administered 2015-07-05 – 2015-07-07 (×5): 50 mg via ORAL
  Filled 2015-07-05 (×5): qty 1

## 2015-07-05 MED ORDER — SODIUM CHLORIDE 0.9 % IV SOLN
Freq: Once | INTRAVENOUS | Status: AC
Start: 2015-07-05 — End: 2015-07-05
  Administered 2015-07-05: 03:00:00 via INTRAVENOUS

## 2015-07-05 MED ORDER — ACETAMINOPHEN 325 MG PO TABS
650.0000 mg | ORAL_TABLET | Freq: Four times a day (QID) | ORAL | Status: DC | PRN
  Administered 2015-07-05 – 2015-07-07 (×3): 650 mg via ORAL
  Filled 2015-07-05 (×3): qty 2

## 2015-07-05 NOTE — Progress Notes (Signed)
[facility] PROGRESS NOTE  Dale Clark ZOX:[id number] DOB: August 11, 1976 DOA: 07/04/2015 PCP: No PCP Per Patient  Assessment/Plan: Supratherapeutic INR - Most likely due to noncompliance with routine monitoring in the setting of patient taking Coumadin - Patient is status post vitamin K administration by ED. INR down to 1.6 from 8.4 - reassess cbc next am.   Anemia -Worse and I suspect patient had some bleeding through GI tract given supratherapeutic INR. At this moment INR is down to 1.6. Patient will be transfused 2 units. After patient was transfused second unit will reassess hemoglobin levels. This is been discussed with nursing and they understand they are to put repeat CBC at appropriate time interval. - At this juncture plan will be to make sure hemoglobin is stable before initiating anticoagulation  Dehydration -Most likely contributing to tachycardia - Patient will be given normal saline fluid bolus - Advance diet  Hyperglycemia - Reassess hemoglobin A1c  Code Status: Full Family Communication: Discussed directly with patient and family member at bedside Disposition Plan: Pending stabilization of hemoglobin and commencement of new anticoagulate   Consultants:  None  Procedures:  None  Antibiotics:  None  HPI/Subjective: Pt has no new complaints. No acute issues reported overnight.  Objective: Filed Vitals:   07/05/15 1315  BP: 115/65  Pulse: 96  Temp: 98.6 F (37 C)  Resp: 20    Intake/Output Summary (Last 24 hours) at 07/05/15 1725 Last data filed at 07/05/15 1316  Gross per 24 hour  Intake    339 ml  Output      0 ml  Net    339 ml   Filed Weights   07/04/15 1406  Weight: 90.719 kg (200 lb)    Exam:   General:  Pt in nad, alert and awake  Cardiovascular: No cyanosis, no edema  Respiratory: No increased work of breathing, no wheezes, equal chest rise  Abdomen: No guarding, nondistended, nontender  Musculoskeletal: No  cyanosis or clubbing   Data Reviewed: Basic Metabolic Panel:  Recent Labs Lab 07/04/15 1459 07/05/15 0100  NA 136 136  K 3.7 3.9  CL 104 107  CO2 25 26  GLUCOSE 133* 138*  BUN 27* 26*  CREATININE 1.22 1.03  CALCIUM 8.4* 7.4*   Liver Function Tests:  Recent Labs Lab 07/04/15 1459  AST 20  ALT 12*  ALKPHOS 48  BILITOT 0.4  PROT 5.4*  ALBUMIN 2.4*   No results for input(s): LIPASE, AMYLASE in the last 168 hours. No results for input(s): AMMONIA in the last 168 hours. CBC:  Recent Labs Lab 07/04/15 1459 07/05/15 0100  WBC 10.8* 9.1  NEUTROABS 7.0  --   HGB 8.9* 5.5*  HCT 26.2* 15.8*  MCV 96.0 95.2  PLT 239 169   Cardiac Enzymes: No results for input(s): CKTOTAL, CKMB, CKMBINDEX, TROPONINI in the last 168 hours. BNP (last 3 results) No results for input(s): BNP in the last 8760 hours.  ProBNP (last 3 results) No results for input(s): PROBNP in the last 8760 hours.  CBG: No results for input(s): GLUCAP in the last 168 hours.  Recent Results (from the past 240 hour(s))  MRSA PCR Screening     Status: None   Collection Time: 07/04/15  7:07 PM  Result Value Ref Range Status   MRSA by PCR NEGATIVE NEGATIVE Final    Comment:        The GeneXpert MRSA Assay (FDA approved for NASAL specimens only), is one component of a comprehensive MRSA colonization surveillance  program. It is not intended to diagnose MRSA infection nor to guide or monitor treatment for MRSA infections.      Studies: Ct Angio Chest Pe W/cm &/or Wo Cm  07/04/2015   CLINICAL DATA:  Shortness of breath and hemoptysis, history of PE in 2003  EXAM: CT ANGIOGRAPHY CHEST WITH CONTRAST  TECHNIQUE: Multidetector CT imaging of the chest was performed using the standard protocol during bolus administration of intravenous contrast. Multiplanar CT image reconstructions and MIPs were obtained to evaluate the vascular anatomy.  CONTRAST:  OMNIPAQUE IOHEXOL 350 MG/ML SOLN  COMPARISON:  07/04/15.   FINDINGS: Lungs are clear. No pleural or pericardial effusion. No significant hilar or mediastinal adenopathy. Thoracic aorta shows no dissection or dilatation. No filling defects in the pulmonary arterial system. No abnormalities in the upper abdomen. No acute musculoskeletal findings.  Review of the MIP images confirms the above findings.  IMPRESSION: Negative study   Electronically Signed   By: Esperanza Heir M.D.   On: 07/04/2015 15:56   Dg Hand 2 View Right  07/05/2015   CLINICAL DATA:  Right hand pain post injury, fifth metacarpal pain  EXAM: RIGHT HAND - 2 VIEW  COMPARISON:  None.  FINDINGS: Two views of the right hand submitted. There is mild displaced angulated fracture distal aspect of fifth metacarpal.  IMPRESSION: Displaced fracture distal aspect of fifth metacarpal.   Electronically Signed   By: Natasha Mead M.D.   On: 07/05/2015 14:21   Dg Chest Portable 1 View  07/04/2015   CLINICAL DATA:  Dizziness.  History of pulmonary embolism.  EXAM: PORTABLE CHEST - 1 VIEW  COMPARISON:  03/16/2014  FINDINGS: Lungs are adequately inflated and otherwise clear. Cardiomediastinal silhouette and remainder of the exam is unchanged.  IMPRESSION: No active disease.   Electronically Signed   By: Elberta Fortis M.D.   On: 07/04/2015 14:54    Scheduled Meds: . sodium chloride  3 mL Intravenous Q12H  . sodium chloride  3 mL Intravenous Q12H   Continuous Infusions:   Principal Problem:   Supratherapeutic INR Active Problems:   Anemia    Time spent: 20 minutes > 50 % of time spent counseling and discussing case with patient and family member at bedside.    Penny Pia  [facility] Pager [phone] If 7PM-7AM, please contact night-coverage at [email or web address], password [facility] 07/05/2015, 5:25 PM  LOS: 1 day

## 2015-07-05 NOTE — Progress Notes (Signed)
Lab reported to nurse patients hgh is at 5.5/ nurse paged hospitalitist with finding at this time(Thomas Janalyn Shy, NP)

## 2015-07-05 NOTE — Progress Notes (Signed)
At 2000 Rolan Lipa, NP paged and informed patients tele reading  Shows  sinus tach ranging bet 120-130's / new order given to give 500cc bolus x1

## 2015-07-06 DIAGNOSIS — R791 Abnormal coagulation profile: Secondary | ICD-10-CM | POA: Diagnosis not present

## 2015-07-06 LAB — CBC
HEMATOCRIT: 20.8 % — AB (ref 39.0–52.0)
HEMOGLOBIN: 7.3 g/dL — AB (ref 13.0–17.0)
MCH: 32.4 pg (ref 26.0–34.0)
MCHC: 35.1 g/dL (ref 30.0–36.0)
MCV: 92.4 fL (ref 78.0–100.0)
Platelets: 157 10*3/uL (ref 150–400)
RBC: 2.25 MIL/uL — ABNORMAL LOW (ref 4.22–5.81)
RDW: 14.3 % (ref 11.5–15.5)
WBC: 9.7 10*3/uL (ref 4.0–10.5)

## 2015-07-06 LAB — BASIC METABOLIC PANEL
Anion gap: 5 (ref 5–15)
BUN: 11 mg/dL (ref 6–20)
CHLORIDE: 105 mmol/L (ref 101–111)
CO2: 26 mmol/L (ref 22–32)
CREATININE: 1.1 mg/dL (ref 0.61–1.24)
Calcium: 8 mg/dL — ABNORMAL LOW (ref 8.9–10.3)
GFR calc Af Amer: >60 mL/min (ref >60)
GFR calc non Af Amer: >60 mL/min (ref >60)
GLUCOSE: 121 mg/dL — AB (ref 65–99)
Potassium: 4 mmol/L (ref 3.5–5.1)
Sodium: 136 mmol/L (ref 135–145)

## 2015-07-06 LAB — PROTIME-INR
INR: 1.04 (ref 0.00–1.49)
Prothrombin Time: 13.8 seconds (ref 11.6–15.2)

## 2015-07-06 LAB — PREPARE RBC (CROSSMATCH)

## 2015-07-06 MED ORDER — HYDROCODONE-ACETAMINOPHEN 5-325 MG PO TABS
1.0000 | ORAL_TABLET | ORAL | Status: DC | PRN
  Administered 2015-07-06 – 2015-07-07 (×3): 1 via ORAL
  Filled 2015-07-06 (×3): qty 1

## 2015-07-06 MED ORDER — SODIUM CHLORIDE 0.9 % IV SOLN
Freq: Once | INTRAVENOUS | Status: AC
  Administered 2015-07-06: 13:00:00 via INTRAVENOUS

## 2015-07-06 NOTE — Care Management Note (Signed)
Case Management Note  Patient Details  Name: Dale Clark MRN: [id number] Date of Birth: 1976-10-02  Subjective/Objective:                  Supratherapeutic INR  Action/Plan: Discharge planning  Expected Discharge Date:  07/06/15               Expected Discharge Plan:  Home/Self Care  In-House Referral:     Discharge planning Services  CM Consult  Post Acute Care Choice:    Choice offered to:     DME Arranged:    DME Agency:     HH Arranged:    HH Agency:     Status of Service:  Completed, signed off  Medicare Important Message Given:    Date Medicare IM Given:    Medicare IM give by:    Date Additional Medicare IM Given:    Additional Medicare Important Message give by:     If discussed at Long Length of Stay Meetings, dates discussed:    Additional Comments: CM spoke with pt and gave him [facility] (also placed on AVS) as a resource to secure a PCP.  Pt verbalized he will call [facility] and follow prompts to secure a PCP.  No other CM needs were communicated. Yves Dill, RN 07/06/2015, 9:09 AM

## 2015-07-06 NOTE — Progress Notes (Signed)
Orthopedic Tech Progress Note Patient Details:  Dale Clark May 24, 1976 [id number]  Ortho Devices Type of Ortho Device: Ulna gutter splint Ortho Device/Splint Location: rue Ortho Device/Splint Interventions: Application   Crawford, Rembert 07/06/2015, 11:18 PM
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
## 2015-07-06 NOTE — Progress Notes (Signed)
Pt agitated and irritable and states he wants to be disconnected from IV and get into shower. Pt receiving IV saline flush s/p blood administration & is on telemetry. This RN explained to pt she will contact Dr and ask if he can go into shower. Pending call back from Dr at this time.

## 2015-07-06 NOTE — Progress Notes (Signed)
Orthopedic Tech Progress Note Patient Details:  Dale Clark 1975-10-24 [id number]  Patient ID: Dale Clark, male   DOB: 12/05/1975, 39 y.o.   MRN: [id number] Viewed order from RN order list  Nikki Dom 07/06/2015, 11:18 PM

## 2015-07-07 DIAGNOSIS — R791 Abnormal coagulation profile: Secondary | ICD-10-CM | POA: Diagnosis not present

## 2015-07-07 LAB — CBC
HEMATOCRIT: 27.7 % — AB (ref 39.0–52.0)
Hemoglobin: 9.4 g/dL — ABNORMAL LOW (ref 13.0–17.0)
MCH: 30.3 pg (ref 26.0–34.0)
MCHC: 33.9 g/dL (ref 30.0–36.0)
MCV: 89.4 fL (ref 78.0–100.0)
PLATELETS: 176 10*3/uL (ref 150–400)
RBC: 3.1 MIL/uL — ABNORMAL LOW (ref 4.22–5.81)
RDW: 15.4 % (ref 11.5–15.5)
WBC: 12 10*3/uL — ABNORMAL HIGH (ref 4.0–10.5)

## 2015-07-07 LAB — TYPE AND SCREEN
ABO/RH(D): O POS
ANTIBODY SCREEN: NEGATIVE
UNIT DIVISION: 0
Unit division: 0
Unit division: 0
Unit division: 0

## 2015-07-07 MED ORDER — HYDROCODONE-ACETAMINOPHEN 5-325 MG PO TABS: 1.0000 | ORAL_TABLET | ORAL | Status: DC | PRN

## 2015-07-07 MED ORDER — RIVAROXABAN 20 MG PO TABS
20.0000 mg | ORAL_TABLET | Freq: Every day | ORAL | Status: DC
  Administered 2015-07-07: 20 mg via ORAL
  Filled 2015-07-07: qty 1

## 2015-07-07 MED ORDER — RIVAROXABAN 20 MG PO TABS: 20.0000 mg | ORAL_TABLET | Freq: Every day | ORAL | Status: DC

## 2015-07-07 NOTE — Progress Notes (Signed)
Nsg Discharge Note  Admit Date:  07/04/2015   Discharge date: 07/07/2015   Dale Clark to be D/C'd Home per MD order.  AVS completed.  Copy for chart, and copy for patient signed, and dated. Patient/caregiver able to verbalize understanding.  Discharge Medication:   Medication List    STOP taking these medications        acetaminophen 500 MG tablet  Commonly known as:  TYLENOL     clindamycin 300 MG capsule  Commonly known as:  CLEOCIN     methocarbamol 500 MG tablet  Commonly known as:  ROBAXIN     methylPREDNISolone 4 MG Tbpk tablet  Commonly known as:  MEDROL DOSEPAK     mupirocin cream 2 %  Commonly known as:  BACTROBAN     predniSONE 10 MG (21) Tbpk tablet  Commonly known as:  STERAPRED UNI-PAK 21 TAB     warfarin 10 MG tablet  Commonly known as:  COUMADIN      TAKE these medications        albuterol 108 (90 BASE) MCG/ACT inhaler  Commonly known as:  PROVENTIL HFA;VENTOLIN HFA  Inhale 2 puffs into the lungs every 6 (six) hours as needed for wheezing or shortness of breath.     HYDROcodone-acetaminophen 5-325 MG per tablet  Commonly known as:  NORCO/VICODIN  Take 1 tablet by mouth every 4 (four) hours as needed.     multivitamin with minerals Tabs tablet  Take 1 tablet by mouth daily.     rivaroxaban 20 MG Tabs tablet  Commonly known as:  XARELTO  Take 1 tablet (20 mg total) by mouth daily with supper.        Discharge Assessment: Filed Vitals:   07/07/15 0359  BP: 128/74  Pulse: 81  Temp: 98.1 F (36.7 C)  Resp: 14  Skin clean, dry and intact without evidence of skin break down, no evidence of skin tears noted. IV catheter discontinued intact. Site without signs and symptoms of complications - no redness or edema noted at insertion site, patient denies c/o pain - only slight tenderness at site.  Dressing with slight pressure applied.  D/c Instructions-Education: Discharge instructions given to patient/family with verbalized  understanding. D/c education completed with patient/family including follow up instructions, medication list, d/c activities limitations if indicated, with other d/c instructions as indicated by MD - patient able to verbalize understanding, all questions fully answered. Patient instructed to return to ED, call 911, or call MD for any changes in condition.  Patient escorted via WC, and D/C home via private auto.  Camillo Flaming, RN 07/07/2015 4:05 PM

## 2015-07-07 NOTE — Care Management Note (Signed)
Case Management Note  Patient Details  Name: Tanav Orsak MRN: [id number] Date of Birth: 09-08-1976  Subjective/Objective:      Date: 07/07/15 Spoke with patient at the bedside. Introduced self as Sports coach and explained role in discharge planning and how to be reached. Verified patient lives in town,  with kids, he is indep. Expressed no need for  DME. Verified patient anticipates to go home with family, at time of discharge and will need no supervision  at this time to best of their knowledge. Patient  denied needing help with their medication. Patient drives  to MD appointments. Patient was given [facility] phone number to help ast with pcp.   Plan: CM will continue to follow for discharge planning and [facility] resources.               Action/Plan:   Expected Discharge Date:  07/06/15               Expected Discharge Plan:  Home/Self Care  In-House Referral:     Discharge planning Services  CM Consult  Post Acute Care Choice:    Choice offered to:     DME Arranged:    DME Agency:     HH Arranged:    HH Agency:     Status of Service:  Completed, signed off  Medicare Important Message Given:    Date Medicare IM Given:    Medicare IM give by:    Date Additional Medicare IM Given:    Additional Medicare Important Message give by:     If discussed at Long Length of Stay Meetings, dates discussed:    Additional Comments:  Leone Haven, RN 07/07/2015, 10:42 AM

## 2015-07-07 NOTE — Progress Notes (Signed)
Orthopedic Tech Progress Note Patient Details:  Dale Clark May 29, 1976 [id number]  Ortho Devices Type of Ortho Device: Ace wrap Ortho Device/Splint Location: rue Ortho Device/Splint Interventions: Application   [name], Dale Clark 07/07/2015, 11:18 AM

## 2015-07-07 NOTE — Discharge Summary (Signed)
Physician Discharge Summary  Kel Senn OZH:[id number] DOB: September 25, 1976 DOA: 07/04/2015  PCP: No PCP Per Patient  Admit date: 07/04/2015 Discharge date: 07/07/2015  Time spent: > 35 minutes  Recommendations for Outpatient Follow-up:  1. Needs to f/u with orthopaedic surgeon in 1-2 weeks after d/c 2. Will need repeat cbc within the next 1 week  Discharge Diagnoses:  Principal Problem:   Supratherapeutic INR Active Problems:   Anemia   Discharge Condition: stable  Diet recommendation: diabetic diet  Filed Weights   07/04/15 1406  Weight: 90.719 kg (200 lb)    History of present illness:    Hospital Course:  Supratherapeutic INR - Most likely due to noncompliance with routine monitoring in the setting of patient taking Coumadin - resolved after Vitamin K administration - d/c coumadin on d/c   Anemia - Stable on day of discharge - No active bleeding reported and hgb level at 9.4  H/o PE - given resolution of bleeding will place on xarelto. If patient develops bleeding he is to stop xarelto and go and get his blood levels checked. This has been discussed with patient and he verbalizes his understanding and agreement.  Dehydration -Resolved   Hyperglycemia -  Will recommend patient f/u with pcp and try diabetic diet and exercise    Fractured 5th metatarsal at right hand. Consulted Orthopaedic surgery and patient placed in splint. Will provide pain medication on discharge  Hyperglycemia/ diabetes mellitus - hgba1c 6.9 - recommend diabetic diet, f/u with pcp, weight loss. Should blood sugars remain high despite this then patient will need more aggressive therapy with oral hypoglycemic agents and frequent blood sugar monitoring.  Procedures:  None  Consultations:  Ortho  Discharge Exam: Filed Vitals:   07/07/15 0359  BP: 128/74  Pulse: 81  Temp: 98.1 F (36.7 C)  Resp: 14    General: Pt in nad, alert and awake Cardiovascular: rrr, no  mrg Respiratory: cta bl, no wheezes  Discharge Instructions   Discharge Instructions    Call MD for:  extreme fatigue    Complete by:  As directed      Call MD for:  redness, tenderness, or signs of infection (pain, swelling, redness, odor or green/yellow discharge around incision site)    Complete by:  As directed      Call MD for:  temperature >100.4    Complete by:  As directed      Diet - low sodium heart healthy    Complete by:  As directed      Discharge instructions    Complete by:  As directed   Please follow up with your primary care physician in 1-2 weeks or sooner should any new concerns arise.     Increase activity slowly    Complete by:  As directed           Current Discharge Medication List    START taking these medications   Details  rivaroxaban (XARELTO) 20 MG TABS tablet Take 1 tablet (20 mg total) by mouth daily with supper. Qty: 30 tablet, Refills: 0      CONTINUE these medications which have CHANGED   Details  HYDROcodone-acetaminophen (NORCO/VICODIN) 5-325 MG per tablet Take 1 tablet by mouth every 4 (four) hours as needed. Qty: 20 tablet, Refills: 0      CONTINUE these medications which have NOT CHANGED   Details  albuterol (PROVENTIL HFA;VENTOLIN HFA) 108 (90 BASE) MCG/ACT inhaler Inhale 2 puffs into the lungs every 6 (six) hours as needed  for wheezing or shortness of breath.    Multiple Vitamin (MULTIVITAMIN WITH MINERALS) TABS tablet Take 1 tablet by mouth daily.    clindamycin (CLEOCIN) 300 MG capsule Take 1 capsule (300 mg total) by mouth 3 (three) times daily. Qty: 21 capsule, Refills: 0      STOP taking these medications     acetaminophen (TYLENOL) 500 MG tablet      warfarin (COUMADIN) 10 MG tablet      methocarbamol (ROBAXIN) 500 MG tablet      methylPREDNISolone (MEDROL DOSEPAK) 4 MG TBPK tablet      mupirocin cream (BACTROBAN) 2 %      predniSONE (STERAPRED UNI-PAK 21 TAB) 10 MG (21) TBPK tablet        No Known  Allergies Follow-up Information    Follow up with [facility].   Why:  Call this number and follow the prompts to get a primary care physician   Contact information:   [phone]       The results of significant diagnostics from this hospitalization (including imaging, microbiology, ancillary and laboratory) are listed below for reference.    Significant Diagnostic Studies: Ct Angio Chest Pe W/cm &/or Wo Cm  07/04/2015   CLINICAL DATA:  Shortness of breath and hemoptysis, history of PE in 2003  EXAM: CT ANGIOGRAPHY CHEST WITH CONTRAST  TECHNIQUE: Multidetector CT imaging of the chest was performed using the standard protocol during bolus administration of intravenous contrast. Multiplanar CT image reconstructions and MIPs were obtained to evaluate the vascular anatomy.  CONTRAST:  OMNIPAQUE IOHEXOL 350 MG/ML SOLN  COMPARISON:  07/04/15.  FINDINGS: Lungs are clear. No pleural or pericardial effusion. No significant hilar or mediastinal adenopathy. Thoracic aorta shows no dissection or dilatation. No filling defects in the pulmonary arterial system. No abnormalities in the upper abdomen. No acute musculoskeletal findings.  Review of the MIP images confirms the above findings.  IMPRESSION: Negative study   Electronically Signed   By: Esperanza Heir M.D.   On: 07/04/2015 15:56   Dg Hand 2 View Right  07/05/2015   CLINICAL DATA:  Right hand pain post injury, fifth metacarpal pain  EXAM: RIGHT HAND - 2 VIEW  COMPARISON:  None.  FINDINGS: Two views of the right hand submitted. There is mild displaced angulated fracture distal aspect of fifth metacarpal.  IMPRESSION: Displaced fracture distal aspect of fifth metacarpal.   Electronically Signed   By: Natasha Mead M.D.   On: 07/05/2015 14:21   Dg Chest Portable 1 View  07/04/2015   CLINICAL DATA:  Dizziness.  History of pulmonary embolism.  EXAM: PORTABLE CHEST - 1 VIEW  COMPARISON:  03/16/2014  FINDINGS: Lungs are adequately inflated and  otherwise clear. Cardiomediastinal silhouette and remainder of the exam is unchanged.  IMPRESSION: No active disease.   Electronically Signed   By: Elberta Fortis M.D.   On: 07/04/2015 14:54    Microbiology: Recent Results (from the past 240 hour(s))  MRSA PCR Screening     Status: None   Collection Time: 07/04/15  7:07 PM  Result Value Ref Range Status   MRSA by PCR NEGATIVE NEGATIVE Final    Comment:        The GeneXpert MRSA Assay (FDA approved for NASAL specimens only), is one component of a comprehensive MRSA colonization surveillance program. It is not intended to diagnose MRSA infection nor to guide or monitor treatment for MRSA infections.      Labs: Basic Metabolic Panel:  Recent Labs Lab  07/04/15 1459 07/05/15 0100 07/06/15 0540  NA 136 136 136  K 3.7 3.9 4.0  CL 104 107 105  CO2 GLUCOSE 133* 138* 121*  BUN 27* 26* 11  CREATININE 1.22 1.03 1.10  CALCIUM 8.4* 7.4* 8.0*   Liver Function Tests:  Recent Labs Lab 07/04/15 1459  AST 20  ALT 12*  ALKPHOS 48  BILITOT 0.4  PROT 5.4*  ALBUMIN 2.4*   No results for input(s): LIPASE, AMYLASE in the last 168 hours. No results for input(s): AMMONIA in the last 168 hours. CBC:  Recent Labs Lab 07/04/15 1459 07/05/15 0100 07/05/15 1852 07/06/15 0540 07/07/15 0003  WBC 10.8* 9.1  --  9.7 12.0*  NEUTROABS 7.0  --   --   --   --   HGB 8.9* 5.5* 8.0* 7.3* 9.4*  HCT 26.2* 15.8* 22.5* 20.8* 27.7*  MCV 96.0 95.2  --  92.4 89.4  PLT 239 169  --  157 176   Cardiac Enzymes: No results for input(s): CKTOTAL, CKMB, CKMBINDEX, TROPONINI in the last 168 hours. BNP: BNP (last 3 results) No results for input(s): BNP in the last 8760 hours.  ProBNP (last 3 results) No results for input(s): PROBNP in the last 8760 hours.  CBG: No results for input(s): GLUCAP in the last 168 hours.     Signed:  Penny Pia  [facility] 07/07/2015, 2:39 PM

## 2015-07-07 NOTE — Discharge Instructions (Signed)
f/u 1-2 weeks in office with:  Mayra Reel, MD [facility]
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
## 2016-07-17 IMAGING — CT CT ANGIO CHEST
2 of 10 series · 19 of 46 positions shown · IV contrast (OMNI)
Comparison: 07/04/15.

CLINICAL DATA: Shortness of breath and hemoptysis, history of PE in
9002

EXAM:
CT ANGIOGRAPHY CHEST WITH CONTRAST
TECHNIQUE: Multidetector CT imaging of the chest was performed using the
standard protocol during bolus administration of intravenous
contrast. Multiplanar CT image reconstructions and MIPs were
obtained to evaluate the vascular anatomy.
CONTRAST:  100mL OMNIPAQUE IOHEXOL 350 MG/ML SOLN

[Series 6: thins · axial · 0.78mm/px · z∈[-520,-262]mm · 16 of 292 slices shown]
[im 17/292  lung]
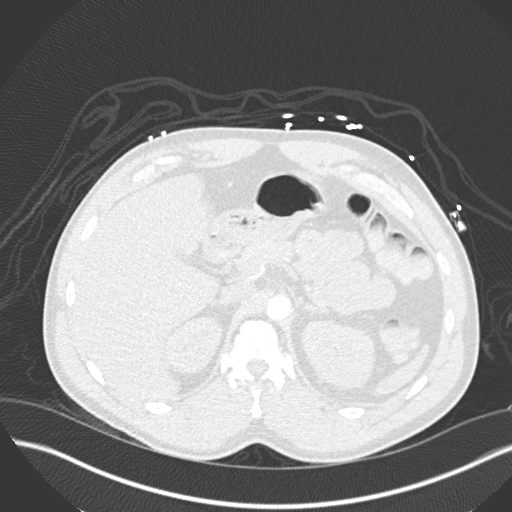
[im 33/292  soft-tissue]
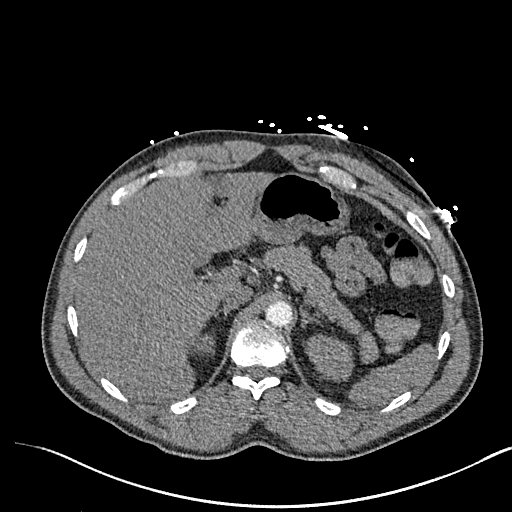
[im 49/292  lung]
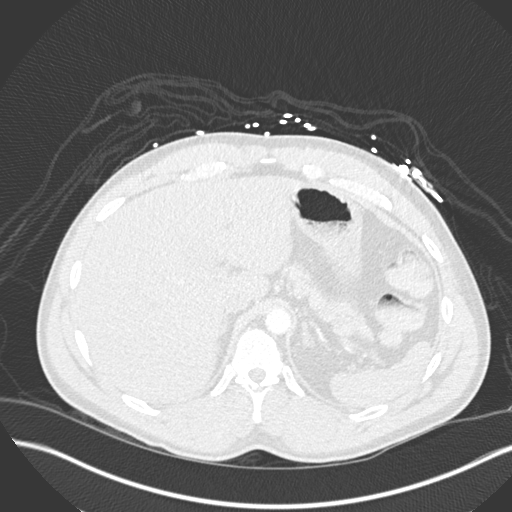
[im 65/292  soft-tissue]
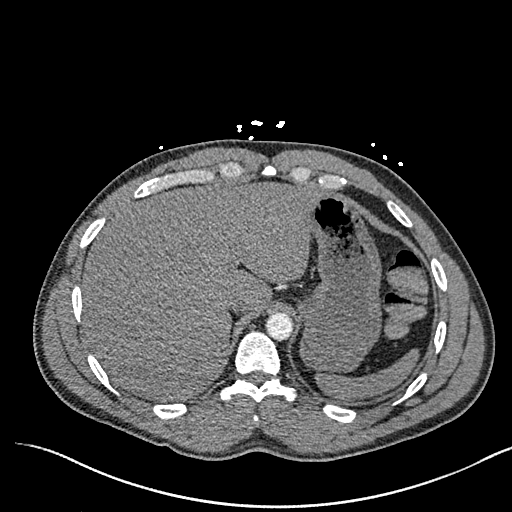
[im 81/292  lung]
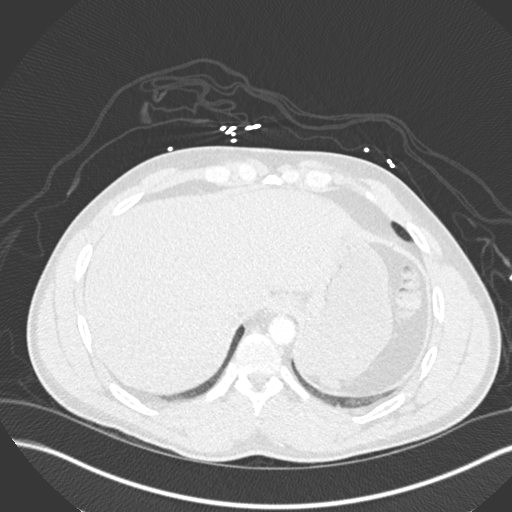
[im 98/292  soft-tissue]
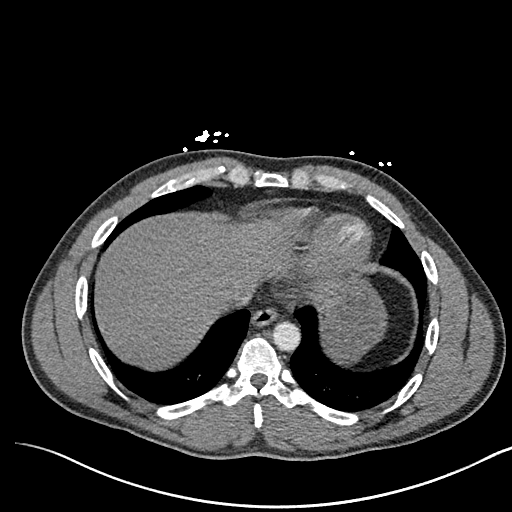
[im 114/292  lung]
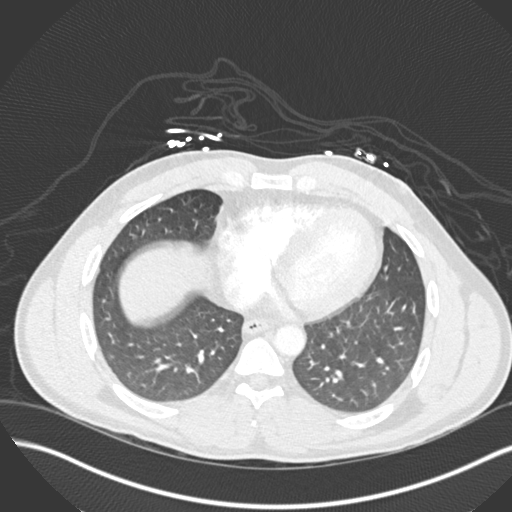
[im 130/292  soft-tissue]
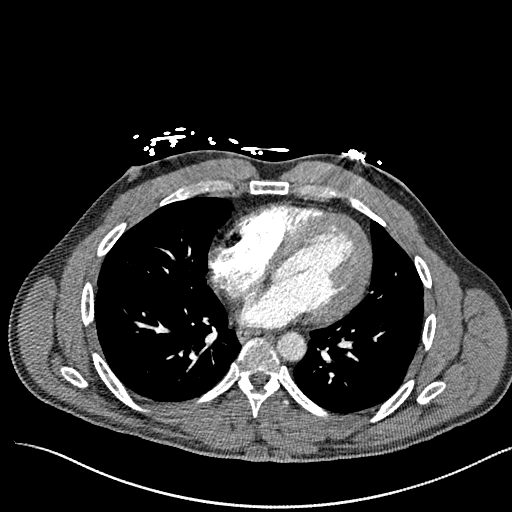
[im 162/292  lung]
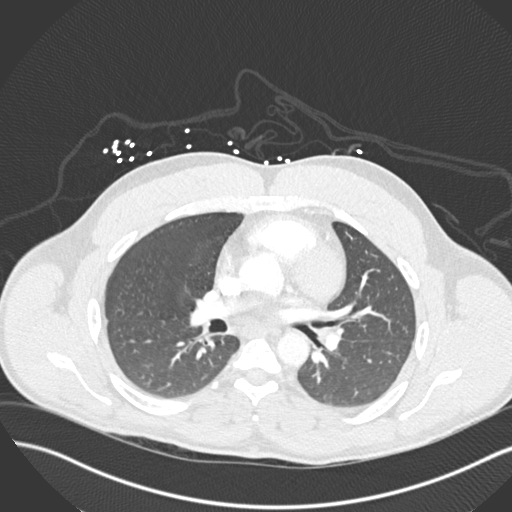
[im 178/292  soft-tissue]
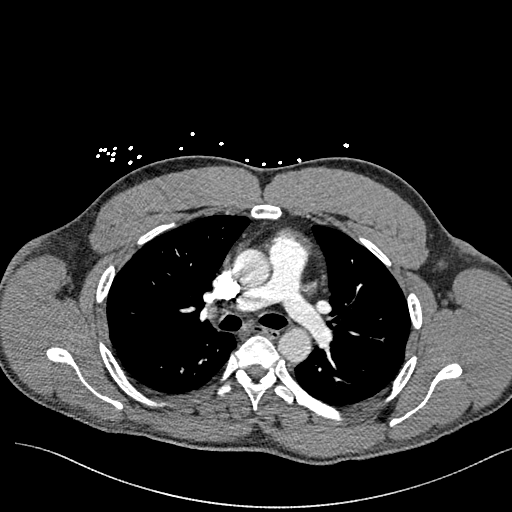
[im 195/292  lung]
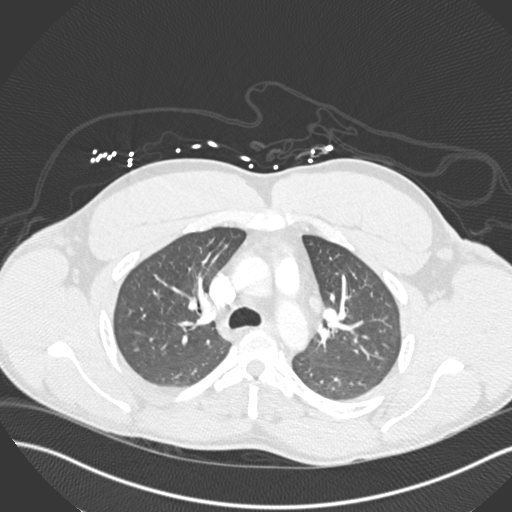
[im 211/292  soft-tissue]
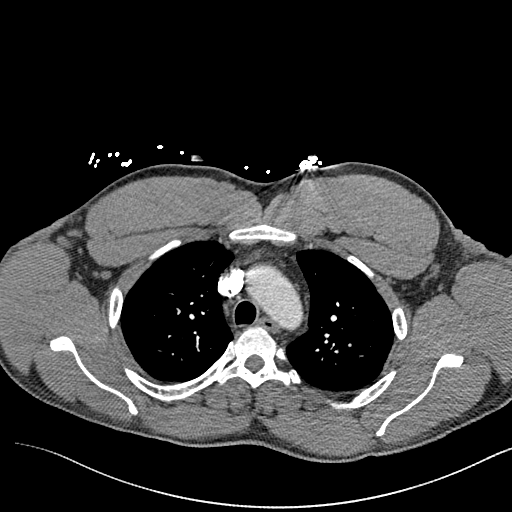
[im 227/292  lung]
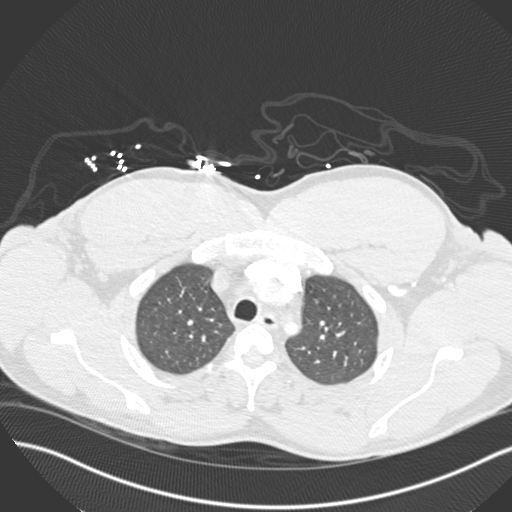
[im 243/292  soft-tissue]
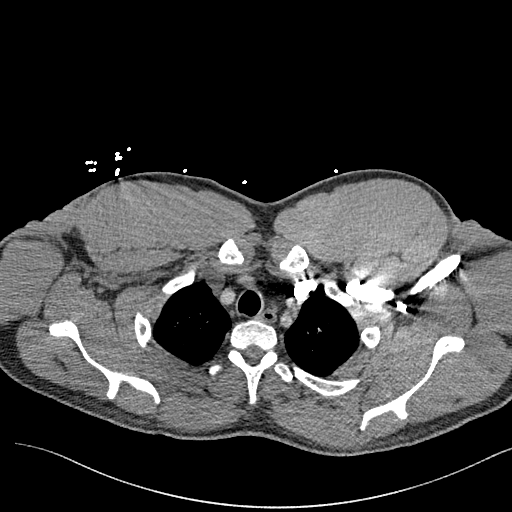
[im 259/292  lung]
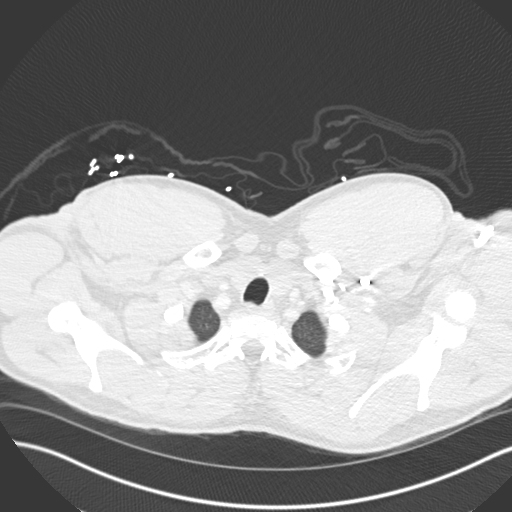
[im 275/292  soft-tissue]
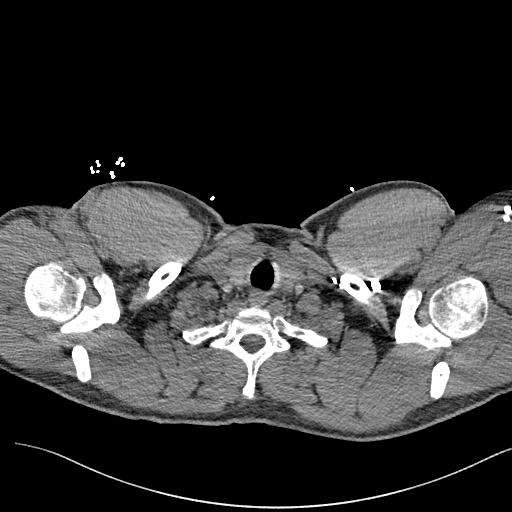

[Series 8: coronal mpr · coronal · 0.60mm/px · 3 of 111 slices shown]
[im 28/111  soft-tissue]
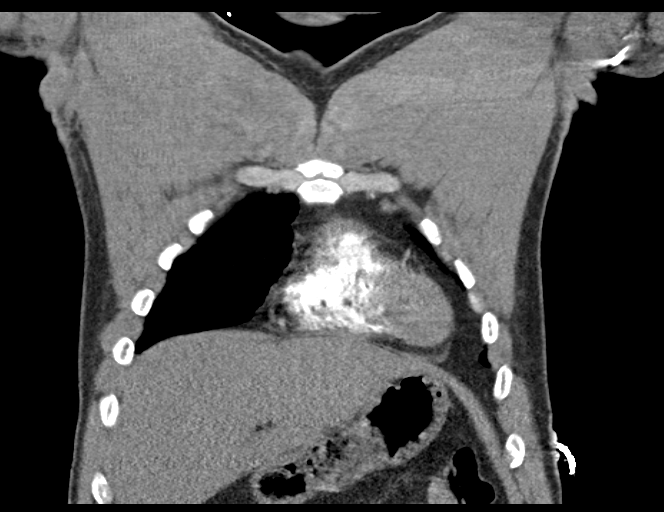
[im 56/111  soft-tissue]
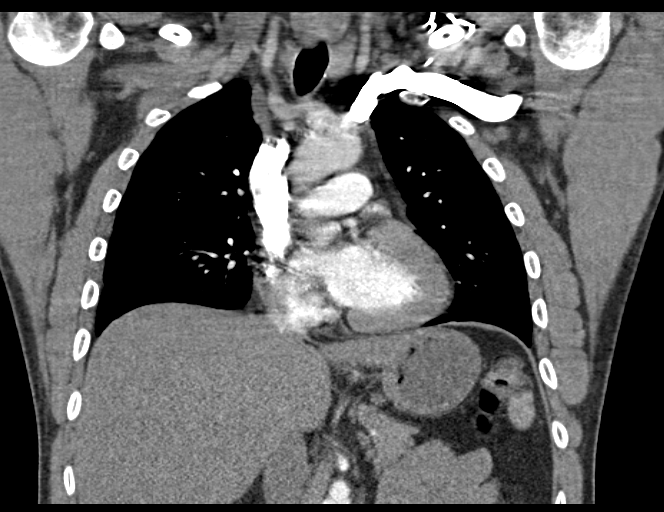
[im 83/111  soft-tissue]
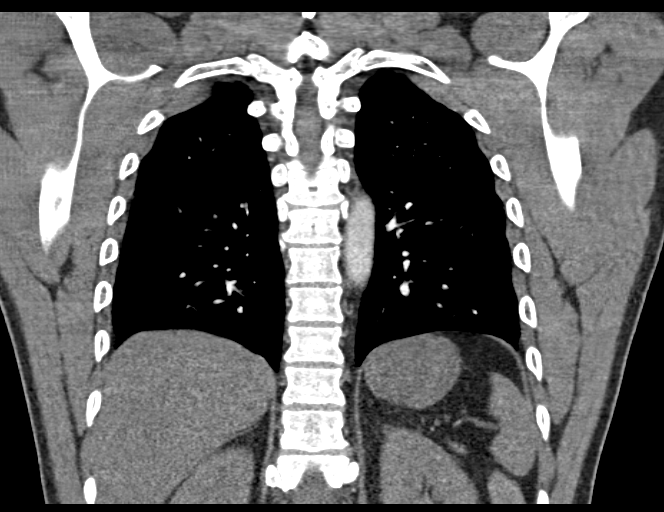

[19 of 46 positions shown; findings below may reference images not displayed]

FINDINGS: Lungs are clear. No pleural or pericardial effusion. No significant
hilar or mediastinal adenopathy. Thoracic aorta shows no dissection
or dilatation. No filling defects in the pulmonary arterial system.
No abnormalities in the upper abdomen. No acute musculoskeletal
findings.

Review of the MIP images confirms the above findings.
IMPRESSION: Negative study

## 2016-07-18 IMAGING — CR DG HAND 2V*R*
2 series · 2 of 2 positions shown · non-contrast
Comparison: None.

CLINICAL DATA: Right hand pain post injury, fifth metacarpal pain

EXAM:
RIGHT HAND - 2 VIEW

[PA]
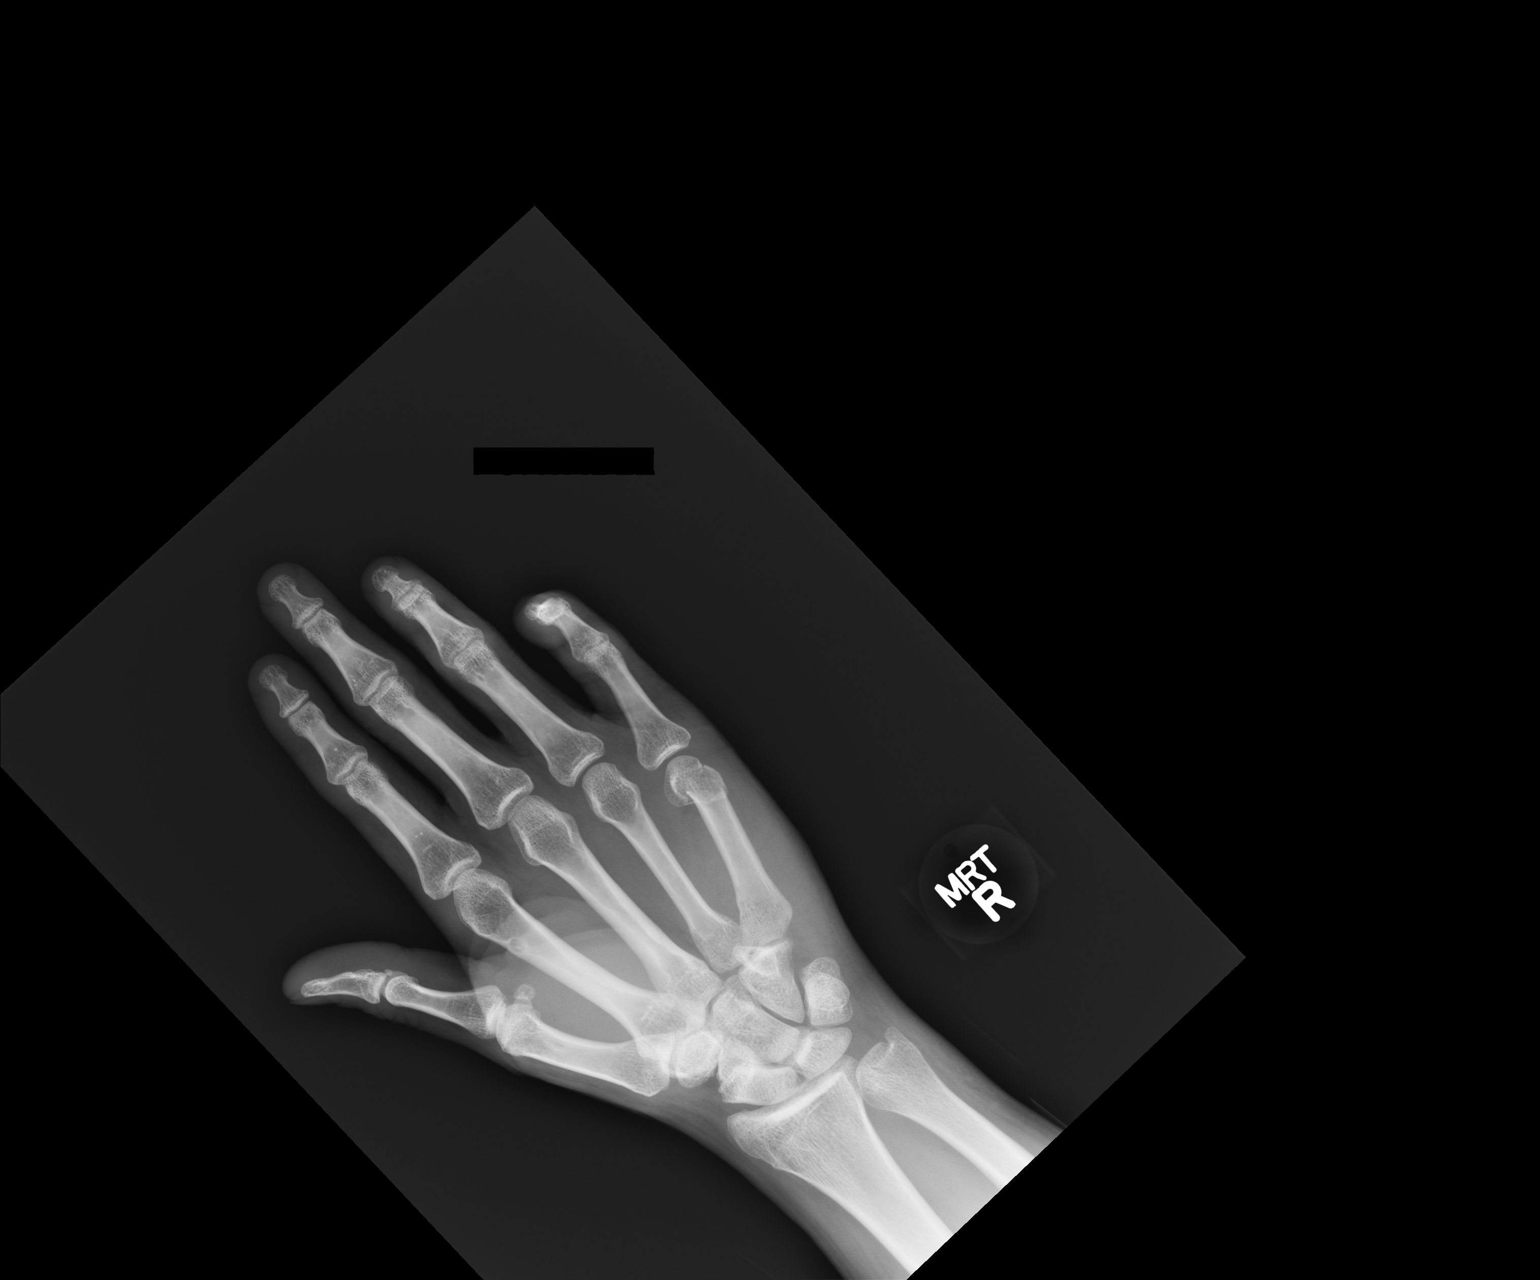

[lateral]
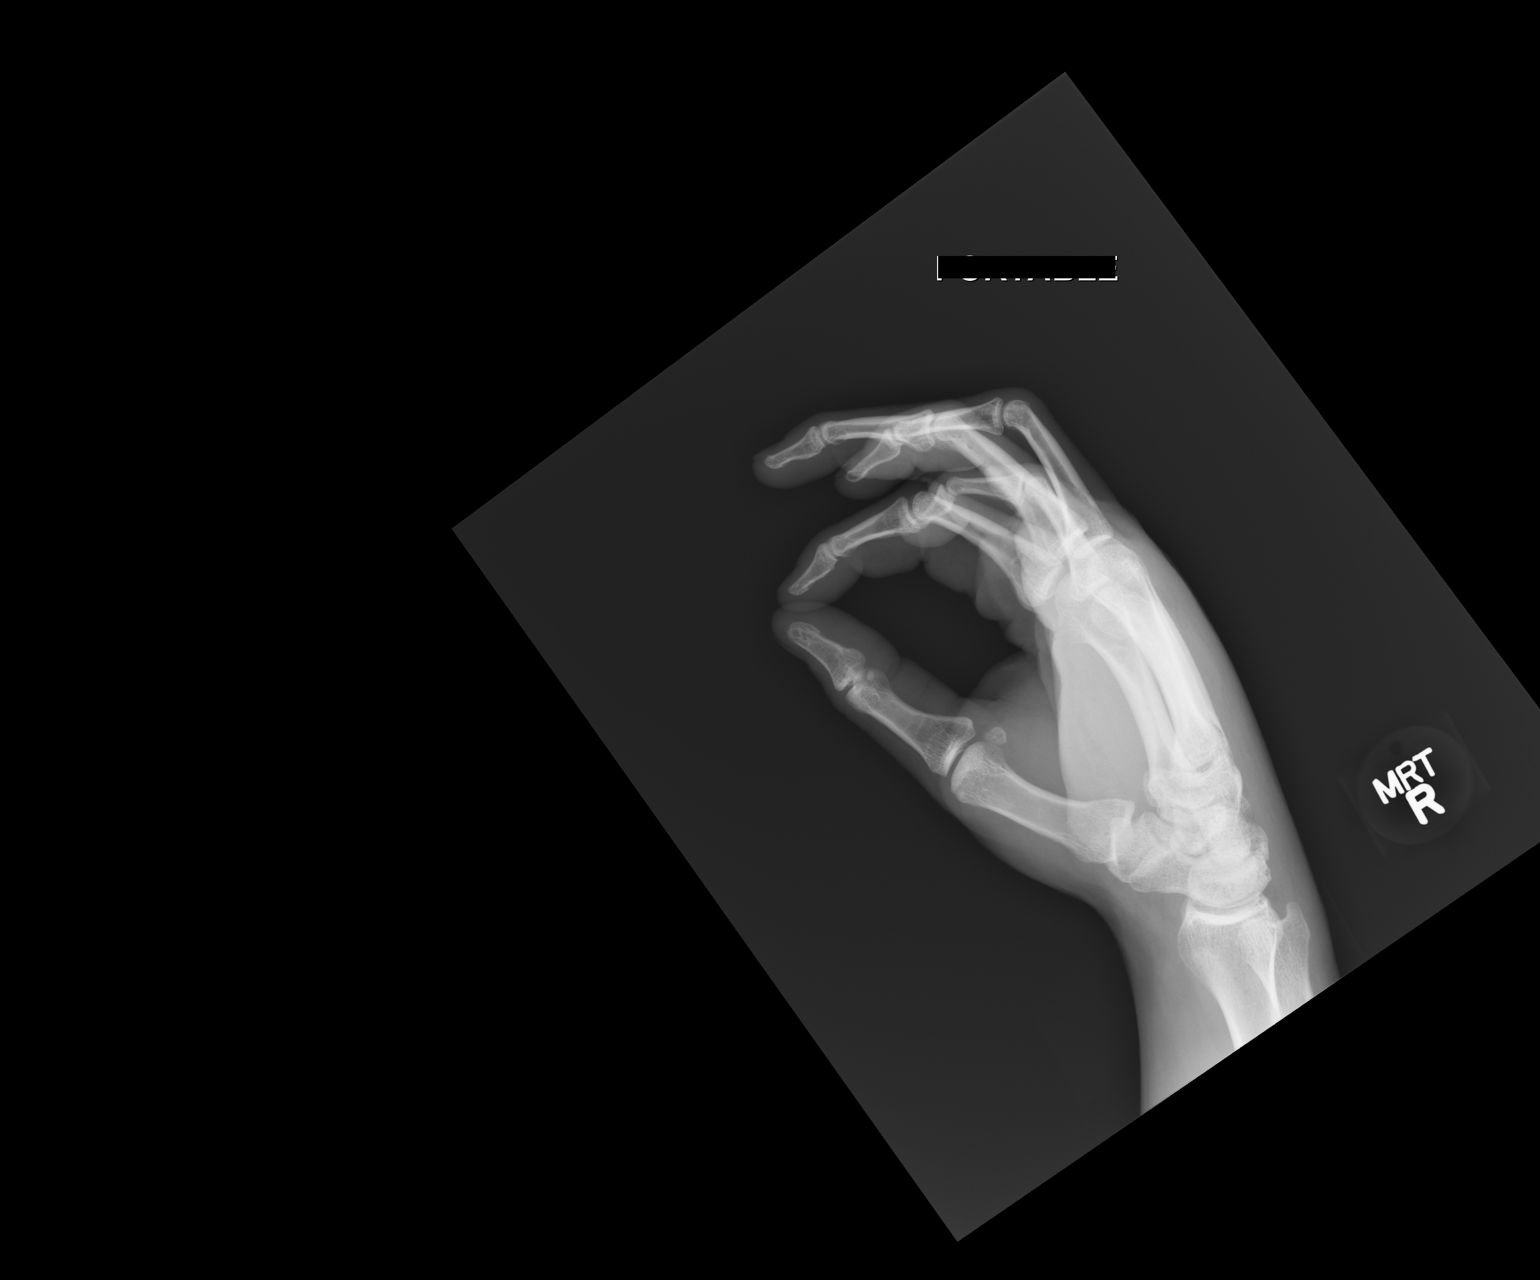

[2 of 2 positions shown; findings below may reference images not displayed]

FINDINGS: Two views of the right hand submitted. There is mild displaced
angulated fracture distal aspect of fifth metacarpal.
IMPRESSION: Displaced fracture distal aspect of fifth metacarpal.

## 2017-03-29 ENCOUNTER — Encounter (HOSPITAL_COMMUNITY): Payer: Self-pay | Admitting: *Deleted

## 2017-03-29 ENCOUNTER — Ambulatory Visit (HOSPITAL_COMMUNITY)
Admission: EM | Admit: 2017-03-29 | Discharge: 2017-03-29 | Disposition: A | Discharge status: Home / Self Care | Payer: Self-pay | Attending: Emergency Medicine | Admitting: Emergency Medicine

## 2017-03-29 DIAGNOSIS — L739 Follicular disorder, unspecified: Secondary | ICD-10-CM

## 2017-03-29 MED ORDER — PREDNISONE 10 MG (21) PO TBPK: ORAL_TABLET | ORAL | 0 refills | Status: DC

## 2017-03-29 MED ORDER — FLUCONAZOLE 100 MG PO TABS: 100.0000 mg | ORAL_TABLET | Freq: Every day | ORAL | 0 refills | Status: DC

## 2017-03-29 NOTE — ED Triage Notes (Signed)
Rash  On    Both   Arms         X     1   Week   Went   Away  And      Came  Back       Hands   Tingling     Off  And  On  X  1  Month       Has  Had   Panic  Attacks    No  Known  Causative     Agents
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
## 2017-03-29 NOTE — Discharge Instructions (Signed)
You most likely have a fungal folliculitis. I have prescribed a short course of antifungals, take one tablet daily. I have also prescribed a short course of prednisone, take as directed, if your symptoms persist past 1 week, return to clinic as needed.

## 2018-11-21 ENCOUNTER — Emergency Department (HOSPITAL_BASED_OUTPATIENT_CLINIC_OR_DEPARTMENT_OTHER)
Admission: EM | Admit: 2018-11-21 | Discharge: 2018-11-21 | Disposition: A | Discharge status: Home / Self Care | Payer: 59 | Attending: Emergency Medicine | Admitting: Emergency Medicine

## 2018-11-21 ENCOUNTER — Encounter (HOSPITAL_BASED_OUTPATIENT_CLINIC_OR_DEPARTMENT_OTHER): Payer: Self-pay | Admitting: Emergency Medicine

## 2018-11-21 ENCOUNTER — Other Ambulatory Visit: Payer: Self-pay

## 2018-11-21 DIAGNOSIS — I10 Essential (primary) hypertension: Secondary | ICD-10-CM

## 2018-11-21 DIAGNOSIS — Z79899 Other long term (current) drug therapy: Secondary | ICD-10-CM | POA: Insufficient documentation

## 2018-11-21 DIAGNOSIS — L299 Pruritus, unspecified: Secondary | ICD-10-CM | POA: Diagnosis not present

## 2018-11-21 DIAGNOSIS — R220 Localized swelling, mass and lump, head: Secondary | ICD-10-CM | POA: Diagnosis not present

## 2018-11-21 DIAGNOSIS — L309 Dermatitis, unspecified: Secondary | ICD-10-CM | POA: Diagnosis not present

## 2018-11-21 DIAGNOSIS — F17210 Nicotine dependence, cigarettes, uncomplicated: Secondary | ICD-10-CM | POA: Insufficient documentation

## 2018-11-21 DIAGNOSIS — Z7901 Long term (current) use of anticoagulants: Secondary | ICD-10-CM | POA: Diagnosis not present

## 2018-11-21 DIAGNOSIS — R21 Rash and other nonspecific skin eruption: Secondary | ICD-10-CM | POA: Diagnosis present

## 2018-11-21 DIAGNOSIS — R69 Illness, unspecified: Secondary | ICD-10-CM | POA: Diagnosis not present

## 2018-11-21 MED ORDER — HYDROXYZINE HCL 25 MG PO TABS: 25.0000 mg | ORAL_TABLET | Freq: Four times a day (QID) | ORAL | 0 refills | Status: DC | PRN

## 2018-11-21 MED ORDER — DEXAMETHASONE SODIUM PHOSPHATE 10 MG/ML IJ SOLN
10.0000 mg | Freq: Once | INTRAMUSCULAR | Status: AC
Start: 2018-11-21 — End: 2018-11-21
  Administered 2018-11-21: 10 mg via INTRAVENOUS
  Filled 2018-11-21: qty 1

## 2018-11-21 MED ORDER — PREDNISONE 10 MG (21) PO TBPK: ORAL_TABLET | ORAL | 0 refills | Status: AC
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
## 2018-11-21 NOTE — Discharge Instructions (Signed)
Follow-up with your primary care doctor to recheck your elevated blood pressure, follow-up with your dermatologist for further evaluation of the rash

## 2018-11-21 NOTE — ED Triage Notes (Signed)
Pt states he was painting and moving last Thursday when he first started having breakout on his skin and swollen on his face getting worse today.

## 2018-11-30 ENCOUNTER — Encounter: Payer: Self-pay | Admitting: Allergy and Immunology

## 2018-11-30 ENCOUNTER — Ambulatory Visit (INDEPENDENT_AMBULATORY_CARE_PROVIDER_SITE_OTHER): Payer: 59 | Admitting: Allergy and Immunology

## 2018-11-30 VITALS — BP 170/98 | HR 100 | Temp 98.6°F | Resp 20 | Ht 70.5 in | Wt 204.0 lb

## 2018-11-30 DIAGNOSIS — T7840XD Allergy, unspecified, subsequent encounter: Secondary | ICD-10-CM

## 2018-11-30 DIAGNOSIS — J4520 Mild intermittent asthma, uncomplicated: Secondary | ICD-10-CM | POA: Diagnosis not present

## 2018-11-30 DIAGNOSIS — R0609 Other forms of dyspnea: Secondary | ICD-10-CM | POA: Diagnosis not present

## 2018-11-30 DIAGNOSIS — L500 Allergic urticaria: Secondary | ICD-10-CM

## 2018-11-30 DIAGNOSIS — I10 Essential (primary) hypertension: Secondary | ICD-10-CM | POA: Diagnosis not present

## 2018-11-30 DIAGNOSIS — T7840XA Allergy, unspecified, initial encounter: Secondary | ICD-10-CM | POA: Insufficient documentation

## 2018-11-30 DIAGNOSIS — T783XXD Angioneurotic edema, subsequent encounter: Secondary | ICD-10-CM | POA: Diagnosis not present

## 2018-11-30 DIAGNOSIS — T783XXA Angioneurotic edema, initial encounter: Secondary | ICD-10-CM | POA: Insufficient documentation

## 2018-11-30 MED ORDER — LEVOCETIRIZINE DIHYDROCHLORIDE 5 MG PO TABS: 5.0000 mg | ORAL_TABLET | Freq: Every day | ORAL | 5 refills | Status: DC | PRN

## 2018-11-30 MED ORDER — TRIAMCINOLONE ACETONIDE 0.1 % EX OINT: TOPICAL_OINTMENT | CUTANEOUS | 1 refills | Status: DC

## 2018-11-30 MED ORDER — ALBUTEROL SULFATE HFA 108 (90 BASE) MCG/ACT IN AERS: 2.0000 | INHALATION_SPRAY | RESPIRATORY_TRACT | 2 refills | Status: DC | PRN

## 2018-11-30 MED ORDER — EPINEPHRINE 0.3 MG/0.3ML IJ SOAJ: 0.3000 mg | INTRAMUSCULAR | 1 refills | Status: DC | PRN

## 2018-11-30 MED ORDER — FLUTICASONE PROPIONATE HFA 110 MCG/ACT IN AERO: 2.0000 | INHALATION_SPRAY | Freq: Two times a day (BID) | RESPIRATORY_TRACT | 5 refills | Status: DC

## 2018-11-30 MED ORDER — MONTELUKAST SODIUM 10 MG PO TABS: 10.0000 mg | ORAL_TABLET | Freq: Every day | ORAL | 5 refills | Status: DC

## 2018-11-30 NOTE — Assessment & Plan Note (Addendum)
The patients history suggests allergic reaction with an unclear trigger.  May have been secondary to paint exposure or urticaria/angioedema triggered by emotional stress. We were unable to perform skin tests today due to recent administration of antihistamine.  We will proceed with in vitro lab studies.  The following labs have been ordered: FCeRI antibody, anti-thyroglobulin antibody, thyroid peroxidase antibody, baseline serum tryptase, C4, CBC, CMP, ESR, ANA, and serum specific IgE against alpha gal panel.   These labs will not be drawn until he has been off of prednisone for at least 10 days.  If laboratory results are unrevealing, he will return for environmental and food allergen skin testing when he has been off of antihistamines for at least 3 days.  A prescription has been provided for levocetirizine, 5 mg daily as needed.  A prescription has been provided for montelukast 10 mg daily at bedtime.  A prescription has been provided for triamcinolone 0.1% ointment to sparingly affected areas twice daily as needed with care to avoid the face, neck, axillae, and groin area.  Should symptoms recur, a journal is to be kept recording any foods eaten, beverages consumed, medications taken within a 6 hour period prior to the onset of symptoms, as well as activities performed, and environmental conditions. For any symptoms concerning for anaphylaxis, epinephrine is to be administered and 911 is to be called immediately.  A prescription has been provided for epinephrine autoinjector (Auvi-Q) 2 pack along with instructions for its proper administration.

## 2018-11-30 NOTE — Assessment & Plan Note (Signed)
   The patient has been made aware of the elevated blood pressure reading and has been encouraged to seek medical attention today regarding this issue.

## 2018-11-30 NOTE — Patient Instructions (Addendum)
Allergic reaction The patients history suggests allergic reaction with an unclear trigger.  May have been secondary to paint exposure or urticaria/angioedema triggered by emotional stress. We were unable to perform skin tests today due to recent administration of antihistamine.  We will proceed with in vitro lab studies.  The following labs have been ordered: FCeRI antibody, anti-thyroglobulin antibody, thyroid peroxidase antibody, baseline serum tryptase, C4, CBC, CMP, ESR, ANA, and serum specific IgE against alpha gal panel.   These labs will not be drawn until he has been off of prednisone for at least 10 days.  If laboratory results are unrevealing, he will return for environmental and food allergen skin testing when he has been off of antihistamines for at least 3 days.  A prescription has been provided for levocetirizine, 5 mg daily as needed.  A prescription has been provided for montelukast 10 mg daily at bedtime.  A prescription has been provided for triamcinolone 0.1% ointment to sparingly affected areas twice daily as needed with care to avoid the face, neck, axillae, and groin area.  Should symptoms recur, a journal is to be kept recording any foods eaten, beverages consumed, medications taken within a 6 hour period prior to the onset of symptoms, as well as activities performed, and environmental conditions. For any symptoms concerning for anaphylaxis, epinephrine is to be administered and 911 is to be called immediately.  A prescription has been provided for epinephrine autoinjector (Auvi-Q) 2 pack along with instructions for its proper administration.  Other forms of dyspnea  A prescription has been provided for Flovent (fluticasone) 110 g,  2 inhalations twice a day. To maximize pulmonary deposition, a spacer has been provided along with instructions for its proper administration with an HFA inhaler.  Montelukast has been prescribed (as above).  A prescription has been  provided for albuterol HFA, 1 to 2 inhalations every 4-6 hours if needed.  Subjective and objective measures of pulmonary function will be followed and the treatment plan will be adjusted accordingly.  Hypertension  The patient has been made aware of the elevated blood pressure reading and has been encouraged to seek medical attention today regarding this issue.     When lab results have returned the patient will be called with further recommendations and follow up instructions.

## 2018-11-30 NOTE — Assessment & Plan Note (Addendum)
   A prescription has been provided for Flovent (fluticasone) 110 g, 2 inhalations twice a day. To maximize pulmonary deposition, a spacer has been provided along with instructions for its proper administration with an HFA inhaler.  Montelukast has been prescribed (as above).  A prescription has been provided for albuterol HFA, 1 to 2 inhalations every 4-6 hours if needed.  Subjective and objective measures of pulmonary function will be followed and the treatment plan will be adjusted accordingly.
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
## 2018-12-11 DIAGNOSIS — M19041 Primary osteoarthritis, right hand: Secondary | ICD-10-CM | POA: Diagnosis not present

## 2018-12-15 ENCOUNTER — Other Ambulatory Visit: Payer: Self-pay

## 2018-12-15 MED ORDER — EPINEPHRINE 0.3 MG/0.3ML IJ SOAJ: 0.3000 mg | INTRAMUSCULAR | 1 refills | Status: DC | PRN

## 2019-02-28 DIAGNOSIS — M25641 Stiffness of right hand, not elsewhere classified: Secondary | ICD-10-CM | POA: Diagnosis not present

## 2019-02-28 DIAGNOSIS — M24541 Contracture, right hand: Secondary | ICD-10-CM | POA: Diagnosis not present

## 2019-02-28 DIAGNOSIS — M6281 Muscle weakness (generalized): Secondary | ICD-10-CM | POA: Diagnosis not present

## 2019-02-28 DIAGNOSIS — M25541 Pain in joints of right hand: Secondary | ICD-10-CM | POA: Diagnosis not present

## 2019-03-06 DIAGNOSIS — M6281 Muscle weakness (generalized): Secondary | ICD-10-CM | POA: Diagnosis not present

## 2019-03-06 DIAGNOSIS — M25641 Stiffness of right hand, not elsewhere classified: Secondary | ICD-10-CM | POA: Diagnosis not present

## 2019-03-06 DIAGNOSIS — M24541 Contracture, right hand: Secondary | ICD-10-CM | POA: Diagnosis not present

## 2019-03-06 DIAGNOSIS — M25541 Pain in joints of right hand: Secondary | ICD-10-CM | POA: Diagnosis not present

## 2019-03-13 DIAGNOSIS — M24541 Contracture, right hand: Secondary | ICD-10-CM | POA: Diagnosis not present

## 2019-03-13 DIAGNOSIS — M25641 Stiffness of right hand, not elsewhere classified: Secondary | ICD-10-CM | POA: Diagnosis not present

## 2019-03-13 DIAGNOSIS — M25541 Pain in joints of right hand: Secondary | ICD-10-CM | POA: Diagnosis not present

## 2019-03-13 DIAGNOSIS — M6281 Muscle weakness (generalized): Secondary | ICD-10-CM | POA: Diagnosis not present

## 2019-04-08 ENCOUNTER — Telehealth (HOSPITAL_BASED_OUTPATIENT_CLINIC_OR_DEPARTMENT_OTHER): Payer: Self-pay | Admitting: Emergency Medicine

## 2019-04-08 ENCOUNTER — Encounter (HOSPITAL_BASED_OUTPATIENT_CLINIC_OR_DEPARTMENT_OTHER): Payer: Self-pay

## 2019-04-08 ENCOUNTER — Other Ambulatory Visit: Payer: Self-pay

## 2019-04-08 ENCOUNTER — Emergency Department (HOSPITAL_BASED_OUTPATIENT_CLINIC_OR_DEPARTMENT_OTHER)
Admission: EM | Admit: 2019-04-08 | Discharge: 2019-04-08 | Disposition: A | Discharge status: Home / Self Care | Payer: 59 | Attending: Emergency Medicine | Admitting: Emergency Medicine

## 2019-04-08 DIAGNOSIS — L02212 Cutaneous abscess of back [any part, except buttock]: Secondary | ICD-10-CM | POA: Diagnosis present

## 2019-04-08 DIAGNOSIS — L089 Local infection of the skin and subcutaneous tissue, unspecified: Secondary | ICD-10-CM | POA: Insufficient documentation

## 2019-04-08 DIAGNOSIS — L720 Epidermal cyst: Secondary | ICD-10-CM | POA: Diagnosis not present

## 2019-04-08 DIAGNOSIS — Z79899 Other long term (current) drug therapy: Secondary | ICD-10-CM | POA: Diagnosis not present

## 2019-04-08 MED ORDER — HYDROMORPHONE HCL 1 MG/ML IJ SOLN
2.0000 mg | Freq: Once | INTRAMUSCULAR | Status: AC
  Administered 2019-04-08: 2 mg via INTRAMUSCULAR
  Filled 2019-04-08: qty 2

## 2019-04-08 MED ORDER — DOXYCYCLINE HYCLATE 100 MG PO TABS
100.0000 mg | ORAL_TABLET | Freq: Once | ORAL | Status: AC
  Administered 2019-04-08: 100 mg via ORAL
  Filled 2019-04-08: qty 1

## 2019-04-08 MED ORDER — HYDROCODONE-ACETAMINOPHEN 5-325 MG PO TABS: 1.0000 | ORAL_TABLET | ORAL | 0 refills | Status: DC | PRN

## 2019-04-08 MED ORDER — DOXYCYCLINE HYCLATE 100 MG PO CAPS: 100.0000 mg | ORAL_CAPSULE | Freq: Two times a day (BID) | ORAL | 0 refills | Status: DC

## 2019-04-08 MED ORDER — LIDOCAINE-EPINEPHRINE (PF) 2 %-1:200000 IJ SOLN
INTRAMUSCULAR | Status: AC
  Administered 2019-04-08: 10 mL
  Filled 2019-04-08: qty 10

## 2019-04-08 NOTE — ED Triage Notes (Signed)
Pt states he has a "boil" on his back that is painful.
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
## 2019-08-09 DIAGNOSIS — L0211 Cutaneous abscess of neck: Secondary | ICD-10-CM | POA: Diagnosis not present

## 2019-08-09 DIAGNOSIS — L720 Epidermal cyst: Secondary | ICD-10-CM | POA: Diagnosis not present

## 2019-08-09 DIAGNOSIS — L700 Acne vulgaris: Secondary | ICD-10-CM | POA: Diagnosis not present

## 2019-10-02 DIAGNOSIS — Z03818 Encounter for observation for suspected exposure to other biological agents ruled out: Secondary | ICD-10-CM | POA: Diagnosis not present

## 2019-10-02 DIAGNOSIS — Z20828 Contact with and (suspected) exposure to other viral communicable diseases: Secondary | ICD-10-CM | POA: Diagnosis not present

## 2019-11-15 DIAGNOSIS — Z3141 Encounter for fertility testing: Secondary | ICD-10-CM | POA: Diagnosis not present

## 2019-11-16 DIAGNOSIS — M9903 Segmental and somatic dysfunction of lumbar region: Secondary | ICD-10-CM | POA: Diagnosis not present

## 2019-11-16 DIAGNOSIS — M545 Low back pain: Secondary | ICD-10-CM | POA: Diagnosis not present

## 2019-11-16 DIAGNOSIS — M9902 Segmental and somatic dysfunction of thoracic region: Secondary | ICD-10-CM | POA: Diagnosis not present

## 2019-11-16 DIAGNOSIS — M9901 Segmental and somatic dysfunction of cervical region: Secondary | ICD-10-CM | POA: Diagnosis not present

## 2019-11-20 DIAGNOSIS — M9903 Segmental and somatic dysfunction of lumbar region: Secondary | ICD-10-CM | POA: Diagnosis not present

## 2019-11-20 DIAGNOSIS — M545 Low back pain: Secondary | ICD-10-CM | POA: Diagnosis not present

## 2019-11-20 DIAGNOSIS — M9901 Segmental and somatic dysfunction of cervical region: Secondary | ICD-10-CM | POA: Diagnosis not present

## 2019-11-20 DIAGNOSIS — M9902 Segmental and somatic dysfunction of thoracic region: Secondary | ICD-10-CM | POA: Diagnosis not present

## 2019-11-29 DIAGNOSIS — Z3141 Encounter for fertility testing: Secondary | ICD-10-CM | POA: Diagnosis not present

## 2020-09-15 DIAGNOSIS — L258 Unspecified contact dermatitis due to other agents: Secondary | ICD-10-CM | POA: Diagnosis not present

## 2020-09-15 DIAGNOSIS — R0981 Nasal congestion: Secondary | ICD-10-CM | POA: Diagnosis not present

## 2020-09-15 DIAGNOSIS — Z20822 Contact with and (suspected) exposure to covid-19: Secondary | ICD-10-CM | POA: Diagnosis not present

## 2021-01-23 DIAGNOSIS — S42141A Displaced fracture of glenoid cavity of scapula, right shoulder, initial encounter for closed fracture: Secondary | ICD-10-CM | POA: Diagnosis not present

## 2021-01-23 DIAGNOSIS — M25511 Pain in right shoulder: Secondary | ICD-10-CM | POA: Diagnosis not present

## 2021-01-23 DIAGNOSIS — S8391XA Sprain of unspecified site of right knee, initial encounter: Secondary | ICD-10-CM | POA: Diagnosis not present

## 2021-01-23 DIAGNOSIS — S43034A Inferior dislocation of right humerus, initial encounter: Secondary | ICD-10-CM | POA: Diagnosis not present

## 2021-01-23 DIAGNOSIS — S8991XA Unspecified injury of right lower leg, initial encounter: Secondary | ICD-10-CM | POA: Diagnosis not present

## 2021-01-23 DIAGNOSIS — S43014A Anterior dislocation of right humerus, initial encounter: Secondary | ICD-10-CM | POA: Diagnosis not present

## 2021-01-23 DIAGNOSIS — S43004A Unspecified dislocation of right shoulder joint, initial encounter: Secondary | ICD-10-CM | POA: Diagnosis not present

## 2021-01-27 DIAGNOSIS — S42141A Displaced fracture of glenoid cavity of scapula, right shoulder, initial encounter for closed fracture: Secondary | ICD-10-CM | POA: Diagnosis not present

## 2021-01-27 DIAGNOSIS — M25511 Pain in right shoulder: Secondary | ICD-10-CM | POA: Diagnosis not present

## 2021-05-06 NOTE — Telephone Encounter (Signed)
From previous role with  

## 2022-11-18 ENCOUNTER — Ambulatory Visit (INDEPENDENT_AMBULATORY_CARE_PROVIDER_SITE_OTHER): Payer: No Typology Code available for payment source | Admitting: Podiatry

## 2022-11-18 DIAGNOSIS — Z91199 Patient's noncompliance with other medical treatment and regimen due to unspecified reason: Secondary | ICD-10-CM

## 2022-11-18 NOTE — Progress Notes (Signed)
No show

## 2022-12-15 ENCOUNTER — Encounter: Payer: Self-pay | Admitting: Skilled Nursing Facility1

## 2022-12-15 ENCOUNTER — Encounter
Payer: No Typology Code available for payment source | Attending: Physician Assistant | Admitting: Skilled Nursing Facility1

## 2022-12-15 DIAGNOSIS — E119 Type 2 diabetes mellitus without complications: Secondary | ICD-10-CM | POA: Insufficient documentation

## 2022-12-15 NOTE — Progress Notes (Signed)
Diabetes Self-Management Education  Visit Type: First/Initial   12/15/2022  [name], identified by name and date of birth, is a 47 y.o. male with a diagnosis of Diabetes: Type 2.   ASSESSMENT  There were no vitals taken for this visit. There is no height or weight on file to calculate BMI.  Patient here with his wife Dale Clark.   Patient left appt stating he needed to use the restroom advising he would like the Dietitian to educate his wife as she is the one that feeds him.  Upon pts return he was in a virtual meeting with ear buds in; Patiens' wife answered all questions asked of him. Pts wife was attentive to the material and asked knowledgeable questions.    Pts wife states He has a Dexcom stating his numbers are about 150  History. HTN and diabetes type 2.  Medications:  Metformin 500 mg twice a day, losartan  Labs:  HgA1c 6.9 % (07/04/15)   Diabetes Self-Management Education - 12/15/22 0954       Visit Information   Visit Type First/Initial      Initial Visit   Diabetes Type Type 2    Date Diagnosed January 2024, but he had a HgA1c of 6.9 5 years ago    Are you currently following a meal plan? No    Are you taking your medications as prescribed? Yes      Health Coping   How would you rate your overall health? Good      Psychosocial Assessment   Patient Belief/Attitude about Diabetes Motivated to manage diabetes    What is the hardest part about your diabetes right now, causing you the most concern, or is the most worrisome to you about your diabetes?   Making healty food and beverage choices    Self-management support Doctor's office;Family    Other persons present Spouse/SO    Special Needs None    Preferred Learning Style No preference indicated    Learning Readiness Not Ready    How often do you need to have someone help you when you read instructions, pamphlets, or other written materials from your doctor or pharmacy? 1 - Never    What is the last  grade level you completed in school? Vocational School      Pre-Education Assessment   Patient understands the diabetes disease and treatment process. Needs Instruction    Patient understands incorporating nutritional management into lifestyle. Needs Instruction    Patient undertands incorporating physical activity into lifestyle. Needs Instruction    Patient understands using medications safely. Needs Instruction    Patient understands monitoring blood glucose, interpreting and using results Needs Instruction    Patient understands prevention, detection, and treatment of acute complications. Needs Instruction    Patient understands prevention, detection, and treatment of chronic complications. Needs Instruction    Patient understands how to develop strategies to address psychosocial issues. Needs Instruction    Patient understands how to develop strategies to promote health/change behavior. Needs Instruction      Complications   Last HgB A1C per patient/outside source 6.9 %   07/04/15   How often do you check your blood sugar? 3-4 times/day    Postprandial Blood glucose range (mg/dL) >200    Have you had a dilated eye exam in the past 12 months? Yes    Have you had a dental exam in the past 12 months? Yes    Are you checking your feet? No  Dietary Intake   Breakfast skip    Snack (morning) cashews    Lunch Hot dogs, chicken winds.    Dinner baked chicken, rice, and beans at least 4 times a week, suchini, collar greens,broccoli.    Snack (evening) ice cream    Beverage(s) Water, 1/2- 1 a gallon per day.  Alcohol 6 onzas of vodka with regular coke every day.  He takes at least 6 cokes a day, 7 days a week.      Activity / Exercise   Activity / Exercise Type ADL's    How many minutes per day do you exercise? --      Patient Education   Previous Diabetes Education No    Disease Pathophysiology Definition of diabetes, type 1 and 2, and the diagnosis of diabetes;Factors that  contribute to the development of diabetes;Explored patient's options for treatment of their diabetes    Healthy Eating Role of diet in the treatment of diabetes and the relationship between the three main macronutrients and blood glucose level;Effects of alcohol on blood glucose and safety factors with consumption of alcohol.;Information on hints to eating out and maintain blood glucose control.    Being Active Role of exercise on diabetes management, blood pressure control and cardiac health.    Monitoring Taught/evaluated CGM (comment)    Chronic complications Relationship between chronic complications and blood glucose control;Dental care;Retinopathy and reason for yearly dilated eye exams;Nephropathy, what it is, prevention of, the use of ACE, ARB's and early detection of through urine microalbumia.;Reviewed with patient heart disease, higher risk of, and prevention      Individualized Goals (developed by patient)   Nutrition General guidelines for healthy choices and portions discussed    Medications take my medication as prescribed    Problem Solving Eating Pattern;Addressing barriers to behavior change      Post-Education Assessment   Patient understands the diabetes disease and treatment process. Comprehends key points    Patient understands incorporating nutritional management into lifestyle. Comprehends key points    Patient undertands incorporating physical activity into lifestyle. Comprehends key points    Patient understands using medications safely. Comphrehends key points    Patient understands monitoring blood glucose, interpreting and using results Comprehends key points    Patient understands prevention, detection, and treatment of acute complications. Comprehends key points    Patient understands prevention, detection, and treatment of chronic complications. Comprehends key points    Patient understands how to develop strategies to address psychosocial issues. Comprehends key  points    Patient understands how to develop strategies to promote health/change behavior. Comprehends key points      Outcomes   Expected Outcomes Demonstrated limited interest in learning.  Expect minimal changes    Future DMSE PRN    Program Status Completed             Individualized Plan for Diabetes Self-Management Training:   Learning Objective:  Patient will have a greater understanding of diabetes self-management. Patient education plan is to attend individual and/or group sessions per assessed needs and concerns.    Expected Outcomes:  Demonstrated limited interest in learning.  Expect minimal changes  Education material provided: ADA - How to Thrive: A Guide for Your Journey with Diabetes, Snack sheet, and Carbohydrate counting sheet  If problems or questions, patient to contact team via:  Phone and Email  Future DSME appointment: PRN

## 2023-10-24 ENCOUNTER — Encounter: Payer: Self-pay | Admitting: Orthopedic Surgery

## 2023-10-24 ENCOUNTER — Other Ambulatory Visit: Payer: Self-pay | Admitting: Orthopedic Surgery

## 2023-10-24 DIAGNOSIS — G8929 Other chronic pain: Secondary | ICD-10-CM

## 2023-10-30 ENCOUNTER — Encounter (HOSPITAL_BASED_OUTPATIENT_CLINIC_OR_DEPARTMENT_OTHER): Payer: Self-pay | Admitting: Emergency Medicine

## 2023-10-30 ENCOUNTER — Inpatient Hospital Stay (HOSPITAL_BASED_OUTPATIENT_CLINIC_OR_DEPARTMENT_OTHER)
Admission: EM | Admit: 2023-10-30 | Discharge: 2023-11-07 | DRG: 871 | Disposition: A | Discharge status: Home / Self Care | Payer: No Typology Code available for payment source | Attending: Internal Medicine | Admitting: Internal Medicine

## 2023-10-30 ENCOUNTER — Emergency Department (HOSPITAL_BASED_OUTPATIENT_CLINIC_OR_DEPARTMENT_OTHER): Payer: No Typology Code available for payment source

## 2023-10-30 ENCOUNTER — Other Ambulatory Visit: Payer: Self-pay

## 2023-10-30 DIAGNOSIS — R6520 Severe sepsis without septic shock: Secondary | ICD-10-CM

## 2023-10-30 DIAGNOSIS — R6521 Severe sepsis with septic shock: Secondary | ICD-10-CM | POA: Diagnosis present

## 2023-10-30 DIAGNOSIS — Z79899 Other long term (current) drug therapy: Secondary | ICD-10-CM

## 2023-10-30 DIAGNOSIS — E861 Hypovolemia: Secondary | ICD-10-CM | POA: Diagnosis present

## 2023-10-30 DIAGNOSIS — J189 Pneumonia, unspecified organism: Secondary | ICD-10-CM | POA: Diagnosis not present

## 2023-10-30 DIAGNOSIS — J101 Influenza due to other identified influenza virus with other respiratory manifestations: Secondary | ICD-10-CM

## 2023-10-30 DIAGNOSIS — E872 Acidosis, unspecified: Secondary | ICD-10-CM | POA: Diagnosis present

## 2023-10-30 DIAGNOSIS — R339 Retention of urine, unspecified: Secondary | ICD-10-CM | POA: Diagnosis present

## 2023-10-30 DIAGNOSIS — E871 Hypo-osmolality and hyponatremia: Secondary | ICD-10-CM | POA: Diagnosis present

## 2023-10-30 DIAGNOSIS — J159 Unspecified bacterial pneumonia: Secondary | ICD-10-CM | POA: Diagnosis present

## 2023-10-30 DIAGNOSIS — K760 Fatty (change of) liver, not elsewhere classified: Secondary | ICD-10-CM | POA: Diagnosis present

## 2023-10-30 DIAGNOSIS — I10 Essential (primary) hypertension: Secondary | ICD-10-CM | POA: Diagnosis present

## 2023-10-30 DIAGNOSIS — Z8249 Family history of ischemic heart disease and other diseases of the circulatory system: Secondary | ICD-10-CM

## 2023-10-30 DIAGNOSIS — J9601 Acute respiratory failure with hypoxia: Secondary | ICD-10-CM | POA: Diagnosis not present

## 2023-10-30 DIAGNOSIS — Z1152 Encounter for screening for COVID-19: Secondary | ICD-10-CM

## 2023-10-30 DIAGNOSIS — N179 Acute kidney failure, unspecified: Secondary | ICD-10-CM | POA: Diagnosis not present

## 2023-10-30 DIAGNOSIS — F17210 Nicotine dependence, cigarettes, uncomplicated: Secondary | ICD-10-CM | POA: Diagnosis present

## 2023-10-30 DIAGNOSIS — Z8672 Personal history of thrombophlebitis: Secondary | ICD-10-CM

## 2023-10-30 DIAGNOSIS — Z833 Family history of diabetes mellitus: Secondary | ICD-10-CM | POA: Diagnosis not present

## 2023-10-30 DIAGNOSIS — Z86711 Personal history of pulmonary embolism: Secondary | ICD-10-CM

## 2023-10-30 DIAGNOSIS — A419 Sepsis, unspecified organism: Secondary | ICD-10-CM | POA: Diagnosis not present

## 2023-10-30 DIAGNOSIS — F1010 Alcohol abuse, uncomplicated: Secondary | ICD-10-CM | POA: Diagnosis present

## 2023-10-30 DIAGNOSIS — D890 Polyclonal hypergammaglobulinemia: Secondary | ICD-10-CM | POA: Diagnosis present

## 2023-10-30 DIAGNOSIS — T4275XA Adverse effect of unspecified antiepileptic and sedative-hypnotic drugs, initial encounter: Secondary | ICD-10-CM | POA: Diagnosis not present

## 2023-10-30 DIAGNOSIS — J1001 Influenza due to other identified influenza virus with the same other identified influenza virus pneumonia: Secondary | ICD-10-CM | POA: Diagnosis present

## 2023-10-30 DIAGNOSIS — A4189 Other specified sepsis: Secondary | ICD-10-CM | POA: Diagnosis present

## 2023-10-30 DIAGNOSIS — E1165 Type 2 diabetes mellitus with hyperglycemia: Secondary | ICD-10-CM | POA: Diagnosis present

## 2023-10-30 DIAGNOSIS — I952 Hypotension due to drugs: Secondary | ICD-10-CM | POA: Diagnosis not present

## 2023-10-30 DIAGNOSIS — J80 Acute respiratory distress syndrome: Secondary | ICD-10-CM | POA: Diagnosis present

## 2023-10-30 DIAGNOSIS — Z7951 Long term (current) use of inhaled steroids: Secondary | ICD-10-CM | POA: Diagnosis not present

## 2023-10-30 LAB — URINALYSIS, ROUTINE W REFLEX MICROSCOPIC
Bilirubin Urine: NEGATIVE
Glucose, UA: NEGATIVE mg/dL
Ketones, ur: NEGATIVE mg/dL
Leukocytes,Ua: NEGATIVE
Nitrite: NEGATIVE
Protein, ur: NEGATIVE mg/dL
Specific Gravity, Urine: 1.01 (ref 1.005–1.030)
pH: 5.5 (ref 5.0–8.0)

## 2023-10-30 LAB — CBC
HCT: 42.6 % (ref 39.0–52.0)
Hemoglobin: 14.6 g/dL (ref 13.0–17.0)
MCH: 32 pg (ref 26.0–34.0)
MCHC: 34.3 g/dL (ref 30.0–36.0)
MCV: 93.4 fL (ref 80.0–100.0)
Platelets: 309 10*3/uL (ref 150–400)
RBC: 4.56 MIL/uL (ref 4.22–5.81)
RDW: 12.4 % (ref 11.5–15.5)
WBC: 17.8 10*3/uL — ABNORMAL HIGH (ref 4.0–10.5)
nRBC: 0 % (ref 0.0–0.2)

## 2023-10-30 LAB — GLUCOSE, CAPILLARY
Glucose-Capillary: 216 mg/dL — ABNORMAL HIGH (ref 70–99)
Glucose-Capillary: 253 mg/dL — ABNORMAL HIGH (ref 70–99)

## 2023-10-30 LAB — I-STAT VENOUS BLOOD GAS, ED
Acid-base deficit: 4 mmol/L — ABNORMAL HIGH (ref 0.0–2.0)
Bicarbonate: 20.2 mmol/L (ref 20.0–28.0)
Calcium, Ion: 0.93 mmol/L — ABNORMAL LOW (ref 1.15–1.40)
HCT: 41 % (ref 39.0–52.0)
Hemoglobin: 13.9 g/dL (ref 13.0–17.0)
O2 Saturation: 88 %
Patient temperature: 101.2
Potassium: 3.9 mmol/L (ref 3.5–5.1)
Sodium: 133 mmol/L — ABNORMAL LOW (ref 135–145)
TCO2: 21 mmol/L — ABNORMAL LOW (ref 22–32)
pCO2, Ven: 35.6 mm[Hg] — ABNORMAL LOW (ref 44–60)
pH, Ven: 7.369 (ref 7.25–7.43)
pO2, Ven: 61 mm[Hg] — ABNORMAL HIGH (ref 32–45)

## 2023-10-30 LAB — LACTIC ACID, PLASMA
Lactic Acid, Venous: 2.8 mmol/L (ref 0.5–1.9)
Lactic Acid, Venous: 2.1 mmol/L (ref 0.5–1.9)

## 2023-10-30 LAB — RESP PANEL BY RT-PCR (RSV, FLU A&B, COVID)  RVPGX2
Influenza A by PCR: POSITIVE — AB
Influenza B by PCR: NEGATIVE
Resp Syncytial Virus by PCR: NEGATIVE
SARS Coronavirus 2 by RT PCR: NEGATIVE

## 2023-10-30 LAB — BASIC METABOLIC PANEL
Anion gap: 12 (ref 5–15)
BUN: 11 mg/dL (ref 6–20)
CO2: 20 mmol/L — ABNORMAL LOW (ref 22–32)
Calcium: 8.1 mg/dL — ABNORMAL LOW (ref 8.9–10.3)
Chloride: 95 mmol/L — ABNORMAL LOW (ref 98–111)
Creatinine, Ser: 1.19 mg/dL (ref 0.61–1.24)
GFR, Estimated: >60 mL/min (ref >60)
Glucose, Bld: 275 mg/dL — ABNORMAL HIGH (ref 70–99)
Potassium: 3.8 mmol/L (ref 3.5–5.1)
Sodium: 127 mmol/L — ABNORMAL LOW (ref 135–145)

## 2023-10-30 LAB — HEMOGLOBIN A1C
Hgb A1c MFr Bld: 8.4 % — ABNORMAL HIGH (ref 4.8–5.6)
Mean Plasma Glucose: 194.38 mg/dL

## 2023-10-30 LAB — URINALYSIS, MICROSCOPIC (REFLEX)

## 2023-10-30 LAB — MRSA NEXT GEN BY PCR, NASAL: MRSA by PCR Next Gen: NOT DETECTED

## 2023-10-30 LAB — CBG MONITORING, ED: Glucose-Capillary: 257 mg/dL — ABNORMAL HIGH (ref 70–99)

## 2023-10-30 LAB — D-DIMER, QUANTITATIVE: D-Dimer, Quant: 4.51 ug{FEU}/mL — ABNORMAL HIGH (ref 0.00–0.50)

## 2023-10-30 MED ORDER — KETOROLAC TROMETHAMINE 15 MG/ML IJ SOLN
15.0000 mg | Freq: Once | INTRAMUSCULAR | Status: AC
Start: 2023-10-30 — End: 2023-10-30
  Administered 2023-10-30: 15 mg via INTRAVENOUS
  Filled 2023-10-30: qty 1

## 2023-10-30 MED ORDER — SODIUM CHLORIDE 0.9 % IV SOLN
500.0000 mg | Freq: Once | INTRAVENOUS | Status: AC
  Administered 2023-10-30: 500 mg via INTRAVENOUS
  Filled 2023-10-30: qty 5

## 2023-10-30 MED ORDER — METHYLPREDNISOLONE SODIUM SUCC 125 MG IJ SOLR
125.0000 mg | INTRAMUSCULAR | Status: DC
  Administered 2023-10-30: 125 mg via INTRAVENOUS
  Filled 2023-10-30: qty 2

## 2023-10-30 MED ORDER — ROCURONIUM BROMIDE 10 MG/ML (PF) SYRINGE
PREFILLED_SYRINGE | INTRAVENOUS | Status: AC
  Filled 2023-10-30: qty 10

## 2023-10-30 MED ORDER — LORAZEPAM 2 MG/ML IJ SOLN
1.0000 mg | INTRAMUSCULAR | Status: DC | PRN
  Administered 2023-10-30 – 2023-10-31 (×3): 2 mg via INTRAVENOUS
  Filled 2023-10-30 (×3): qty 1

## 2023-10-30 MED ORDER — ORAL CARE MOUTH RINSE: 15.0000 mL | OROMUCOSAL | Status: DC | PRN

## 2023-10-30 MED ORDER — SODIUM CHLORIDE 0.9 % IV BOLUS
1000.0000 mL | Freq: Once | INTRAVENOUS | Status: AC
  Administered 2023-10-30: 1000 mL via INTRAVENOUS

## 2023-10-30 MED ORDER — FOLIC ACID 5 MG/ML IJ SOLN
1.0000 mg | Freq: Every day | INTRAMUSCULAR | Status: DC
Start: 2023-10-30 — End: 2023-11-01
  Administered 2023-10-30 – 2023-11-01 (×3): 1 mg via INTRAVENOUS
  Filled 2023-10-30 (×3): qty 0.2

## 2023-10-30 MED ORDER — SODIUM CHLORIDE 0.9 % IV SOLN
2.0000 g | Freq: Once | INTRAVENOUS | Status: AC
  Administered 2023-10-30: 2 g via INTRAVENOUS
  Filled 2023-10-30: qty 20

## 2023-10-30 MED ORDER — THIAMINE HCL 100 MG/ML IJ SOLN
100.0000 mg | Freq: Every day | INTRAMUSCULAR | Status: DC
  Administered 2023-10-30 – 2023-11-01 (×3): 100 mg via INTRAVENOUS
  Filled 2023-10-30 (×3): qty 2

## 2023-10-30 MED ORDER — OSELTAMIVIR PHOSPHATE 75 MG PO CAPS
75.0000 mg | ORAL_CAPSULE | Freq: Two times a day (BID) | ORAL | Status: DC
  Administered 2023-10-30: 75 mg via ORAL
  Filled 2023-10-30 (×2): qty 1

## 2023-10-30 MED ORDER — ALBUTEROL (5 MG/ML) CONTINUOUS INHALATION SOLN
10.0000 mg/h | INHALATION_SOLUTION | Freq: Once | RESPIRATORY_TRACT | Status: DC
  Filled 2023-10-30: qty 20

## 2023-10-30 MED ORDER — PIPERACILLIN-TAZOBACTAM 3.375 G IVPB 30 MIN
3.3750 g | Freq: Once | INTRAVENOUS | Status: AC
  Administered 2023-10-30: 3.375 g via INTRAVENOUS
  Filled 2023-10-30: qty 50

## 2023-10-30 MED ORDER — ENOXAPARIN SODIUM 40 MG/0.4ML IJ SOSY
40.0000 mg | PREFILLED_SYRINGE | Freq: Every day | INTRAMUSCULAR | Status: DC
  Administered 2023-10-31 – 2023-11-06 (×7): 40 mg via SUBCUTANEOUS
  Filled 2023-10-30 (×8): qty 0.4

## 2023-10-30 MED ORDER — IOHEXOL 350 MG/ML SOLN
100.0000 mL | Freq: Once | INTRAVENOUS | Status: AC | PRN
  Administered 2023-10-30: 100 mL via INTRAVENOUS

## 2023-10-30 MED ORDER — VANCOMYCIN HCL IN DEXTROSE 1-5 GM/200ML-% IV SOLN
1000.0000 mg | Freq: Once | INTRAVENOUS | Status: AC
  Administered 2023-10-30: 1000 mg via INTRAVENOUS
  Filled 2023-10-30: qty 200

## 2023-10-30 MED ORDER — SODIUM CHLORIDE 0.9 % IV SOLN
500.0000 mg | INTRAVENOUS | Status: AC
  Administered 2023-10-31 – 2023-11-03 (×4): 500 mg via INTRAVENOUS
  Filled 2023-10-30 (×4): qty 5

## 2023-10-30 MED ORDER — BENZONATATE 100 MG PO CAPS
200.0000 mg | ORAL_CAPSULE | Freq: Three times a day (TID) | ORAL | Status: DC | PRN
  Administered 2023-11-05 (×2): 200 mg via ORAL
  Filled 2023-10-30 (×3): qty 2

## 2023-10-30 MED ORDER — LORAZEPAM 2 MG/ML IJ SOLN
INTRAMUSCULAR | Status: AC
  Filled 2023-10-30: qty 1

## 2023-10-30 MED ORDER — IPRATROPIUM-ALBUTEROL 0.5-2.5 (3) MG/3ML IN SOLN
3.0000 mL | RESPIRATORY_TRACT | Status: DC | PRN
  Administered 2023-11-02: 3 mL via RESPIRATORY_TRACT
  Filled 2023-10-30: qty 3

## 2023-10-30 MED ORDER — VANCOMYCIN HCL IN DEXTROSE 1-5 GM/200ML-% IV SOLN: 1000.0000 mg | Freq: Two times a day (BID) | INTRAVENOUS | Status: DC

## 2023-10-30 MED ORDER — PIPERACILLIN-TAZOBACTAM 3.375 G IVPB
3.3750 g | Freq: Three times a day (TID) | INTRAVENOUS | Status: DC
  Administered 2023-10-30: 3.375 g via INTRAVENOUS
  Filled 2023-10-30: qty 50

## 2023-10-30 MED ORDER — IPRATROPIUM-ALBUTEROL 0.5-2.5 (3) MG/3ML IN SOLN
3.0000 mL | Freq: Once | RESPIRATORY_TRACT | Status: AC
  Administered 2023-10-30: 3 mL via RESPIRATORY_TRACT
  Filled 2023-10-30: qty 3

## 2023-10-30 MED ORDER — MELATONIN 5 MG PO TABS
5.0000 mg | ORAL_TABLET | Freq: Every evening | ORAL | Status: DC | PRN
  Administered 2023-10-30: 5 mg via ORAL
  Filled 2023-10-30: qty 1

## 2023-10-30 MED ORDER — SODIUM CHLORIDE 0.9 % IV SOLN: Freq: Once | INTRAVENOUS | Status: AC

## 2023-10-30 MED ORDER — POLYETHYLENE GLYCOL 3350 17 G PO PACK
17.0000 g | PACK | Freq: Every day | ORAL | Status: DC | PRN
Start: 2023-10-30 — End: 2023-10-31

## 2023-10-30 MED ORDER — ADULT MULTIVITAMIN W/MINERALS CH
1.0000 | ORAL_TABLET | Freq: Every day | ORAL | Status: DC
  Administered 2023-10-30: 1 via ORAL
  Filled 2023-10-30: qty 1

## 2023-10-30 MED ORDER — LORAZEPAM 2 MG/ML IJ SOLN
1.0000 mg | Freq: Once | INTRAMUSCULAR | Status: AC
  Administered 2023-10-30: 1 mg via INTRAVENOUS
  Filled 2023-10-30: qty 1

## 2023-10-30 MED ORDER — PANTOPRAZOLE SODIUM 40 MG PO TBEC: 40.0000 mg | DELAYED_RELEASE_TABLET | Freq: Every day | ORAL | Status: DC

## 2023-10-30 MED ORDER — SODIUM CHLORIDE 0.9 % IV SOLN
2.0000 g | INTRAVENOUS | Status: AC
  Administered 2023-10-31 – 2023-11-03 (×4): 2 g via INTRAVENOUS
  Filled 2023-10-30 (×4): qty 20

## 2023-10-30 MED ORDER — CHLORHEXIDINE GLUCONATE CLOTH 2 % EX PADS
6.0000 | MEDICATED_PAD | Freq: Every day | CUTANEOUS | Status: DC
  Administered 2023-10-30 – 2023-11-05 (×7): 6 via TOPICAL

## 2023-10-30 MED ORDER — ALBUTEROL SULFATE (2.5 MG/3ML) 0.083% IN NEBU
2.5000 mg | INHALATION_SOLUTION | Freq: Once | RESPIRATORY_TRACT | Status: AC
Start: 2023-10-30 — End: 2023-10-30
  Administered 2023-10-30: 2.5 mg via RESPIRATORY_TRACT
  Filled 2023-10-30: qty 3

## 2023-10-30 MED ORDER — AZITHROMYCIN 500 MG IV SOLR: 500.0000 mg | INTRAVENOUS | Status: DC

## 2023-10-30 MED ORDER — INSULIN ASPART 100 UNIT/ML IJ SOLN
0.0000 [IU] | INTRAMUSCULAR | Status: DC
  Administered 2023-10-30 (×2): 5 [IU] via SUBCUTANEOUS
  Administered 2023-10-31: 3 [IU] via SUBCUTANEOUS
  Administered 2023-10-31: 5 [IU] via SUBCUTANEOUS

## 2023-10-30 MED ORDER — BENZONATATE 100 MG PO CAPS
200.0000 mg | ORAL_CAPSULE | Freq: Once | ORAL | Status: AC
  Administered 2023-10-30: 200 mg via ORAL
  Filled 2023-10-30: qty 2

## 2023-10-30 MED ORDER — LORAZEPAM 2 MG/ML IJ SOLN
0.5000 mg | Freq: Once | INTRAMUSCULAR | Status: AC
  Administered 2023-10-30: 0.5 mg via INTRAVENOUS
  Filled 2023-10-30: qty 1

## 2023-10-30 MED ORDER — LABETALOL HCL 5 MG/ML IV SOLN
10.0000 mg | INTRAVENOUS | Status: DC | PRN
  Administered 2023-10-31: 20 mg via INTRAVENOUS
  Administered 2023-11-02: 10 mg via INTRAVENOUS
  Administered 2023-11-03: 20 mg via INTRAVENOUS
  Filled 2023-10-30 (×3): qty 4

## 2023-10-30 MED ORDER — FUROSEMIDE 10 MG/ML IJ SOLN
40.0000 mg | Freq: Once | INTRAMUSCULAR | Status: AC
  Administered 2023-10-30: 40 mg via INTRAVENOUS
  Filled 2023-10-30: qty 4

## 2023-10-30 MED ORDER — DOCUSATE SODIUM 100 MG PO CAPS: 100.0000 mg | ORAL_CAPSULE | Freq: Two times a day (BID) | ORAL | Status: DC | PRN

## 2023-10-30 MED ORDER — LACTATED RINGERS IV SOLN: INTRAVENOUS | Status: DC

## 2023-10-30 MED ORDER — IPRATROPIUM-ALBUTEROL 0.5-2.5 (3) MG/3ML IN SOLN
3.0000 mL | RESPIRATORY_TRACT | Status: DC
  Administered 2023-10-30: 3 mL via RESPIRATORY_TRACT
  Filled 2023-10-30: qty 3

## 2023-10-30 MED ORDER — BUDESONIDE 0.5 MG/2ML IN SUSP
0.5000 mg | Freq: Two times a day (BID) | RESPIRATORY_TRACT | Status: DC
  Administered 2023-10-30: 0.5 mg via RESPIRATORY_TRACT
  Filled 2023-10-30: qty 2

## 2023-10-30 MED ORDER — LABETALOL HCL 5 MG/ML IV SOLN
10.0000 mg | INTRAVENOUS | Status: DC | PRN
Start: 2023-10-30 — End: 2023-10-30
  Administered 2023-10-30: 20 mg via INTRAVENOUS
  Filled 2023-10-30: qty 4

## 2023-10-30 MED ORDER — KETAMINE HCL 10 MG/ML IJ SOLN
INTRAMUSCULAR | Status: AC
  Filled 2023-10-30: qty 1

## 2023-10-30 MED ORDER — SODIUM CHLORIDE 0.9 % IV BOLUS (SEPSIS)
1000.0000 mL | Freq: Once | INTRAVENOUS | Status: AC
  Administered 2023-10-30: 1000 mL via INTRAVENOUS

## 2023-10-30 MED ORDER — ORAL CARE MOUTH RINSE
15.0000 mL | OROMUCOSAL | Status: DC
  Administered 2023-10-30 – 2023-10-31 (×5): 15 mL via OROMUCOSAL

## 2023-10-30 MED ORDER — GUAIFENESIN 100 MG/5ML PO LIQD
10.0000 mL | Freq: Four times a day (QID) | ORAL | Status: DC
  Administered 2023-10-30: 10 mL via ORAL
  Filled 2023-10-30: qty 10

## 2023-10-30 MED ORDER — ACETAMINOPHEN 325 MG PO TABS: 650.0000 mg | ORAL_TABLET | ORAL | Status: DC | PRN

## 2023-10-30 NOTE — Progress Notes (Signed)
 Significantly SOB, SAT's 79. Rate 45. Placed back on bipap: contacted respiratory to follow up
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
## 2023-10-30 NOTE — ED Notes (Signed)
 Unable to collect urine drug abuse screen as we do not carry the supplies for [facility]

## 2023-10-30 NOTE — ED Notes (Signed)
 ED Provider at bedside.

## 2023-10-30 NOTE — Progress Notes (Signed)
 Pt on 8L [place]. SP02 dropped to the 70's RR in the 40's. Pt placed back on BIPAP on documented settings. SpO2 increase to upper 90's. Pt tolerating well.

## 2023-10-30 NOTE — Progress Notes (Signed)
 Restless, worked up. saying he can't breath. he's really anxious. they gave him ativan 3 times just to tolerate the bipap. [facility] CONTACTED

## 2023-10-30 NOTE — ED Notes (Signed)
Pt is unable to provide urine sample at this time. 

## 2023-10-30 NOTE — ED Provider Notes (Signed)
 [place] [facility] AT [facility] Provider Note   CSN: [id number] Arrival date & time: 10/30/23  1333     History  Chief Complaint  Patient presents with   Shortness of Breath    Dale Clark is a 48 y.o. male with history of hypertension, diabetes, pulmonary embolism, who presents to the emergency department complaining of shortness of breath, weakness, cough.  Patient's wife states that he has had some sick symptoms for the past 2 days or so.  He had been taking over-the-counter medications with minimal relief.  Today he started having worsening dizziness and weakness.  Did have a few episodes of vomiting.  Felt lightheaded but did not lose consciousness.  Has a lot of pain in his abdomen when he coughs, no headache or bodyaches, no chest pain.  States that he was checking his oxygen level at home and it was in the 80s, and highest temperature at home was 102F.   Shortness of Breath Associated symptoms: cough, fever, vomiting and wheezing        Home Medications Prior to Admission medications   Medication Sig Start Date End Date Taking? Authorizing Provider  albuterol  (PROAIR  HFA) 108 (90 Base) MCG/ACT inhaler Inhale 2 puffs into the lungs every 4 (four) hours as needed for wheezing or shortness of breath. 11/30/18   Bobbitt, Elgin Pepper, MD  albuterol  (PROVENTIL  HFA;VENTOLIN  HFA) 108 (90 BASE) MCG/ACT inhaler Inhale 2 puffs into the lungs every 6 (six) hours as needed for wheezing or shortness of breath.    [provider]  doxycycline  (VIBRAMYCIN ) 100 MG capsule Take 1 capsule (100 mg total) by mouth 2 (two) times daily. One po bid x 7 days 04/08/19   Molpus, John, MD  EPINEPHrine  (AUVI-Q ) 0.3 mg/0.3 mL IJ SOAJ injection Inject 0.3 mLs (0.3 mg total) into the muscle as needed for anaphylaxis. 12/15/18   Bobbitt, Elgin Pepper, MD  fluticasone  (FLOVENT  HFA) 110 MCG/ACT inhaler Inhale 2 puffs into the lungs 2 (two) times daily. 11/30/18   Bobbitt,  Elgin Pepper, MD  HYDROcodone -acetaminophen  (NORCO) 5-325 MG tablet Take 1 tablet by mouth every 4 (four) hours as needed (for pain). 04/08/19   Molpus, John, MD  hydrOXYzine  (ATARAX /VISTARIL ) 25 MG tablet Take 1 tablet (25 mg total) by mouth every 6 (six) hours as needed for itching. 11/21/18   Randol Simmonds, MD  levocetirizine (XYZAL ) 5 MG tablet Take 1 tablet (5 mg total) by mouth daily as needed. 11/30/18   Bobbitt, Elgin Pepper, MD  montelukast  (SINGULAIR ) 10 MG tablet Take 1 tablet (10 mg total) by mouth at bedtime. 11/30/18   Bobbitt, Elgin Pepper, MD  triamcinolone  ointment (KENALOG ) 0.1 % Apply sparingly to affected areas twice daily as needed below face and neck 11/30/18   Bobbitt, Elgin Pepper, MD      Allergies    Patient has no known allergies.    Review of Systems   Review of Systems  Constitutional:  Positive for fatigue and fever.  Respiratory:  Positive for cough, shortness of breath and wheezing.   Gastrointestinal:  Positive for nausea and vomiting.  Neurological:  Positive for weakness.  All other systems reviewed and are negative.   Physical Exam Updated Vital Signs BP (!) 157/99   Pulse (!) 141   Temp 99 F (37.2 C)   Resp (!) 21   Ht 5' 10 (1.778 m)   Wt 90.7 kg   SpO2 (!) 87%   BMI 28.70 kg/m  Physical Exam Vitals and  nursing note reviewed.  Constitutional:      Appearance: Normal appearance.  HENT:     Head: Normocephalic and atraumatic.  Eyes:     Conjunctiva/sclera: Conjunctivae normal.  Cardiovascular:     Rate and Rhythm: Regular rhythm. Tachycardia present.     Pulses:          Posterior tibial pulses are 2+ on the right side and 2+ on the left side.  Pulmonary:     Effort: Pulmonary effort is normal. No respiratory distress.     Breath sounds: Wheezing and rales present.  Abdominal:     General: There is no distension.     Palpations: Abdomen is soft.     Tenderness: There is no abdominal tenderness.  Musculoskeletal:     Right lower leg: No  edema.     Left lower leg: No edema.  Skin:    General: Skin is warm and dry.  Neurological:     General: No focal deficit present.     Mental Status: He is alert.     ED Results / Procedures / Treatments   Labs (all labs ordered are listed, but only abnormal results are displayed) Labs Reviewed  RESP PANEL BY RT-PCR (RSV, FLU A&B, COVID)  RVPGX2 - Abnormal; Notable for the following components:      Result Value   Influenza A by PCR POSITIVE (*)    All other components within normal limits  CBC - Abnormal; Notable for the following components:   WBC 17.8 (*)    All other components within normal limits  BASIC METABOLIC PANEL - Abnormal; Notable for the following components:   Sodium 127 (*)    Chloride 95 (*)    CO2 20 (*)    Glucose, Bld 275 (*)    Calcium  8.1 (*)    All other components within normal limits  D-DIMER, QUANTITATIVE - Abnormal; Notable for the following components:   D-Dimer, Quant 4.51 (*)    All other components within normal limits  LACTIC ACID, PLASMA - Abnormal; Notable for the following components:   Lactic Acid, Venous 2.8 (*)    All other components within normal limits  LACTIC ACID, PLASMA - Abnormal; Notable for the following components:   Lactic Acid, Venous 2.1 (*)    All other components within normal limits  CULTURE, BLOOD (ROUTINE X 2)  CULTURE, BLOOD (ROUTINE X 2)  MRSA NEXT GEN BY PCR, NASAL  URINALYSIS, ROUTINE W REFLEX MICROSCOPIC  URINE DRUGS OF ABUSE SCREEN W ALC, ROUTINE (REF LAB)    EKG EKG Interpretation Date/Time:  Sunday October 30 2023 14:22:51 EST Ventricular Rate:  134 PR Interval:  125 QRS Duration:  81 QT Interval:  307 QTC Calculation: 459 R Axis:   52  Text Interpretation: Sinus tachycardia Probable left atrial enlargement Baseline wander in lead(s) III aVL aVF V6 Confirmed by Elnor Savant ([id number]) on 10/30/2023 3:37:59 PM  Radiology CT Angio Chest PE W and/or Wo Contrast Result Date: 10/30/2023 CLINICAL DATA:   Positive D-dimer.  Short of breath.  Tachycardia. EXAM: CT ANGIOGRAPHY CHEST WITH CONTRAST TECHNIQUE: Multidetector CT imaging of the chest was performed using the standard protocol during bolus administration of intravenous contrast. Multiplanar CT image reconstructions and MIPs were obtained to evaluate the vascular anatomy. RADIATION DOSE REDUCTION: This exam was performed according to the departmental dose-optimization program which includes automated exposure control, adjustment of the mA and/or kV according to patient size and/or use of iterative reconstruction technique. CONTRAST:  100mL OMNIPAQUE   IOHEXOL  350 MG/ML SOLN COMPARISON:  None Available. FINDINGS: Cardiovascular: No filling defects within the pulmonary arteries to suggest acute pulmonary embolism. Mediastinum/Nodes: Small mediastinal lymph nodes are not pathologic by size criteria. There is multiple prominent small axillary nodes which are upper limits of normal at 10 mm. Skin thickening of the LEFT and RIGHT axilla (image 38/302, for example). Lungs/Pleura: No pulmonary infarction. There is diffuse airspace disease in perihilar distribution in the upper and lower lobes on the RIGHT. Upper Abdomen: Limited view of the liver, kidneys, pancreas are unremarkable. Normal adrenal glands. Musculoskeletal: No aggressive osseous lesion. Review of the MIP images confirms the above findings. IMPRESSION: 1. No evidence acute pulmonary embolism. 2. Large regions confluent perihilar airspace disease extending from the hilum and sparing the sub pleural lung. Differential would include pulmonary edema versus multifocal atypical pneumonia versus other inflammatory or toxic etiologies. 3. Bilateral mild axillary adenopathy with associated skin thickening. Findings are similar to CT 2016 and favored benign/chronic. Electronically Signed   By: Jackquline Boxer M.D.   On: 10/30/2023 16:38   DG Chest Portable 1 View Result Date: 10/30/2023 CLINICAL DATA:  Cough  EXAM: PORTABLE CHEST 1 VIEW COMPARISON:  07/04/2015 FINDINGS: Heart size within normal limits. Extensive bilateral airspace opacities most pronounced within the bilateral perihilar and right basilar regions. No pleural effusion or pneumothorax. IMPRESSION: Extensive bilateral airspace opacities, right worse than left. This may represent multifocal pneumonia or pulmonary edema. Electronically Signed   By: Mabel Converse D.O.   On: 10/30/2023 16:11    Procedures .Critical Care  Performed by: Corie Friddie DASEN, PA-C Authorized by: Corie Friddie DASEN, PA-C   Critical care provider statement:    Critical care time (minutes):  75   Critical care time was exclusive of:  Separately billable procedures and treating other patients and teaching time   Critical care was necessary to treat or prevent imminent or life-threatening deterioration of the following conditions:  Sepsis and respiratory failure   Critical care was time spent personally by me on the following activities:  Development of treatment plan with patient or surrogate, discussions with consultants, evaluation of patient's response to treatment, examination of patient, ordering and review of laboratory studies, ordering and review of radiographic studies, ordering and performing treatments and interventions, pulse oximetry, re-evaluation of patient's condition and review of old charts   I assumed direction of critical care for this patient from another provider in my specialty: no     Care discussed with: admitting provider   Comments:     Acute respiratory failure with hypoxia requiring high flow, Bipap; sepsis requiring aggressive IVF, abx     Medications Ordered in ED Medications  0.9 %  sodium chloride  infusion (has no administration in time range)  methylPREDNISolone  sodium succinate (SOLU-MEDROL ) 125 mg/2 mL injection 125 mg (125 mg Intravenous Given 10/30/23 1738)  vancomycin  (VANCOCIN ) IVPB 1000 mg/200 mL premix (1,000 mg  Intravenous New Bag/Given 10/30/23 1751)    Followed by  vancomycin  (VANCOCIN ) IVPB 1000 mg/200 mL premix (has no administration in time range)  piperacillin -tazobactam (ZOSYN ) IVPB 3.375 g (3.375 g Intravenous New Bag/Given 10/30/23 1755)  piperacillin -tazobactam (ZOSYN ) IVPB 3.375 g (has no administration in time range)  vancomycin  (VANCOCIN ) IVPB 1000 mg/200 mL premix (has no administration in time range)  azithromycin  (ZITHROMAX ) 500 mg in sodium chloride  0.9 % 250 mL IVPB (has no administration in time range)  ipratropium-albuterol  (DUONEB) 0.5-2.5 (3) MG/3ML nebulizer solution 3 mL (3 mLs Nebulization Given 10/30/23 1437)  sodium chloride  0.9 %  bolus 1,000 mL (0 mLs Intravenous Stopped 10/30/23 1603)  sodium chloride  0.9 % bolus 1,000 mL (1,000 mLs Intravenous New Bag/Given 10/30/23 1623)  sodium chloride  0.9 % bolus 1,000 mL (1,000 mLs Intravenous New Bag/Given 10/30/23 1627)  cefTRIAXone  (ROCEPHIN ) 2 g in sodium chloride  0.9 % 100 mL IVPB (0 g Intravenous Stopped 10/30/23 1701)  azithromycin  (ZITHROMAX ) 500 mg in sodium chloride  0.9 % 250 mL IVPB (0 mg Intravenous Stopped 10/30/23 1802)  iohexol  (OMNIPAQUE ) 350 MG/ML injection 100 mL (100 mLs Intravenous Contrast Given 10/30/23 1609)  benzonatate  (TESSALON ) capsule 200 mg (200 mg Oral Given 10/30/23 1638)  LORazepam  (ATIVAN ) injection 1 mg (1 mg Intravenous Given 10/30/23 1731)  ipratropium-albuterol  (DUONEB) 0.5-2.5 (3) MG/3ML nebulizer solution 3 mL (3 mLs Nebulization Given 10/30/23 1757)  albuterol  (PROVENTIL ) (2.5 MG/3ML) 0.083% nebulizer solution 2.5 mg (2.5 mg Nebulization Given 10/30/23 1757)    ED Course/ Medical Decision Making/ A&P Clinical Course as of 10/30/23 1819  Sun Oct 30, 2023  1536 Sodium(!): 127 Poor PO last few days, minimal solid food, mostly only having water/liquids [SG]  1537 D-Dimer, Quant(!): 4.51 CTPE ordered [SG]  1555 Lactic acid 2.8, code sepsis activated. IVF and antibiotics ordered [LR]  1620 Remains dyspneic  after HHN & O2, RR 30, HR 130s, SpO2 low 90s, placed on 4L [facility] [LR]  1630 SP02 92-93% on 6L. HR 125 [LR]  1650 Desat 87%, placed on 100% NRB HR 133, RR 30s, SpO2 slowly coming up [LR]  1713 Transitioned to high flow nasal cannula, 60 LPM [LR]  1730 Patient placed in Bipap. Improved air movement, tolerating well, saturations up to 95%.  [LR]    Clinical Course User Index [LR] Sharlotte Baka T, PA-C [SG] Elnor Jayson LABOR, DO                                 Medical Decision Making Amount and/or Complexity of Data Reviewed Labs: ordered. Decision-making details documented in ED Course. Radiology: ordered.  Risk Prescription drug management. Decision regarding hospitalization.  This patient is a 48 y.o. male who presents to the ED for concern of cough and shortness of breath, this involves an extensive number of treatment options, and is a complaint that carries with it a high risk of complications and morbidity. The emergent differential diagnosis prior to evaluation includes, but is not limited to,  HF, pericardial effusion/tamponade, arrhythmias, ACS, COPD, asthma, bronchitis, pneumonia, pneumothorax, PE, anemia. . This is not an exhaustive differential.   Past Medical History / Co-morbidities / Social History: hypertension, diabetes, pulmonary embolism  Additional history: Chart reviewed. Pertinent results include: Patient believes his pulmonary embolism was back in 2015 or so.  Physical Exam: Physical exam performed. The pertinent findings include: Tachycardic, temperature 36F.  91% oxygen saturation on room air, but on 2 L nasal cannula.  Rales in bilateral lower lobes with some mild wheezing.  No peripheral edema.  Quickly progressed to worsening hypoxia requiring high flow and requiring bipap. Worsening rales.   Lab Tests: I ordered, and personally interpreted labs.  The pertinent results include:  WBC 17.8, Na 127, Cl 95. Initial lactic acid 2.8, recheck 2.1. D-dimer 4.51.  Respiratory panel positive for influenza A.    Imaging Studies: I ordered imaging studies including CXR and CT PE. I independently visualized and interpreted imaging which showed CXR with concern for multifocal pneumonia vs pulmonary edema.  CT PE study shows no PE but large regions confluent  perihilar airspace disease extending from hilum, pulmonary edema vs multifocal atypical pneumonia.  I agree with the radiologist interpretation.  Cardiac Monitoring:  The patient was maintained on a cardiac monitor.  My attending physician Dr. Albertina viewed and interpreted the cardiac monitored which showed an underlying rhythm of: sinus tachycardia. I agree with this interpretation.   Medications: I ordered medication including IVF, duoneb/albuterol , empiric antibiotics, solumedrol, ativan  to help tolerate bipap .Reevaluation of the patient after these medicines showed that the patient  Initially worsened, improved after bipap . I have reviewed the patients home medicines and have made adjustments as needed.  Consultations Obtained: I requested consultation with the intensivist Dr Claudene,  and discussed lab and imaging findings as well as pertinent plan - they recommend: broadening antibiotics, solumedrol, discuss/plan for intubation. Will admit to ICU at [facility].    Disposition: After consideration of the diagnostic results and the patients response to treatment, I feel that patient is requiring admission to the hospital with acute respiratory failure with hypoxia in the setting of influenza A.   I discussed this case with my attending physician Dr. Albertina who cosigned this note including patient's presenting symptoms, physical exam, and planned diagnostics and interventions. Attending physician stated agreement with plan or made changes to plan which were implemented.    Final Clinical Impression(s) / ED Diagnoses Final diagnoses:  Hyponatremia  Influenza A  Acute respiratory failure with hypoxia  (HCC)    Rx / DC Orders ED Discharge Orders     None      Portions of this report may have been transcribed using voice recognition software. Every effort was made to ensure accuracy; however, inadvertent computerized transcription errors may be present.    Nichollas Perusse T, PA-C 10/30/23 1958    Albertina Dixon, MD 10/31/23 1321

## 2023-10-30 NOTE — Progress Notes (Signed)
 [facility] Physician-Brief Progress Note Patient Name: Dale Clark DOB: September 21, 1976 MRN: [id number]   Date of Service  10/30/2023  HPI/Events of Note  85 M smoker, history of HTN, DM, PE, presented with increased SOB with cough, fever and chills. Wife as sick contact. Chest CT without PE but with bilateral patchy areas of consolidation and ground glass opacities. Influenza A positive.  Seen on BiPap restless  eICU Interventions  On ceftriaxone  and azithromycin  for possible secondary bacterial infection Ordered Oseltamivir  BSRN reports open wounds from his history of hidradenitis suppurativa. MRSA screen pending. He did receive a dose of vancomycin . Ordered CIWA protocol given history of daily ETOH intake. This will include Precedex  and Ativan .     Intervention Category Evaluation Type: New Patient Evaluation  [name] 10/30/2023, 10:29 PM

## 2023-10-30 NOTE — Progress Notes (Signed)
 Pharmacy Antibiotic Note  Dale Clark is a 48 y.o. male admitted on 10/30/2023 presenting with SOB and fatigue, concern for pna.  Pharmacy has been consulted for zosyn  and vancomycin  dosing.  Plan: Vancomycin  2g IV x 1, then 1g IV q 12h (eAUC 432) Add MRSA PCR Zosyn  3.375g IV q 8h (extended 4h infusion) Monitor renal function, Cx/PCR to narrow Vancomycin  levels as indicated   Height: 5' 10 (177.8 cm) Weight: 90.7 kg (200 lb) IBW/kg (Calculated) : 73  Temp (24hrs), Avg:99 F (37.2 C), Min:99 F (37.2 C), Max:99 F (37.2 C)  Recent Labs  Lab 10/30/23 1447 10/30/23 1448  WBC 17.8*  --   CREATININE 1.19  --   LATICACIDVEN  --  2.8*    Estimated Creatinine Clearance: 87.9 mL/min (by C-G formula based on SCr of 1.19 mg/dL).    No Known Allergies  Dorn Poot, PharmD, [facility] Clinical Pharmacist ED Pharmacist Phone # [phone] 10/30/2023 5:29 PM

## 2023-10-30 NOTE — ED Triage Notes (Signed)
 Pt c/o SHOB, dizziness, racing heart rate and vomiting since Fri; c/o fatigue, general weakness

## 2023-10-30 NOTE — ED Notes (Addendum)
Called [facility] for transport 

## 2023-10-30 NOTE — Progress Notes (Signed)
 10/30/2023 Influenza ARDS Steroids, tamiflu, broad spectrum abx, bipap trial Needs to be at [facility] in case needs ECMO.  Myrla Halsted MD PCCM

## 2023-10-30 NOTE — ED Notes (Signed)
 RT at bedside.

## 2023-10-30 NOTE — Sepsis Progress Note (Signed)
 Elink following code sepsis

## 2023-10-30 NOTE — ED Notes (Signed)
 Late entry, after brief trail of HHFNC at 60lpm and  0.8 FiO2, moved to room 14 and placed on NIV Peep 5.0 PC 10, back up rate 10 and Fio2 0.40.  Spo2 95-99%, bilat breath sound rhonchi t/o.  Tolerating  large NIV mask at this time.

## 2023-10-31 ENCOUNTER — Inpatient Hospital Stay (HOSPITAL_COMMUNITY): Payer: No Typology Code available for payment source | Admitting: Critical Care Medicine

## 2023-10-31 ENCOUNTER — Inpatient Hospital Stay (HOSPITAL_COMMUNITY): Payer: No Typology Code available for payment source

## 2023-10-31 ENCOUNTER — Other Ambulatory Visit: Payer: Self-pay

## 2023-10-31 DIAGNOSIS — J9601 Acute respiratory failure with hypoxia: Secondary | ICD-10-CM | POA: Diagnosis not present

## 2023-10-31 DIAGNOSIS — J80 Acute respiratory distress syndrome: Secondary | ICD-10-CM

## 2023-10-31 LAB — BASIC METABOLIC PANEL
Anion gap: 10 (ref 5–15)
Anion gap: 14 (ref 5–15)
BUN: 9 mg/dL (ref 6–20)
BUN: 10 mg/dL (ref 6–20)
CO2: 21 mmol/L — ABNORMAL LOW (ref 22–32)
CO2: 20 mmol/L — ABNORMAL LOW (ref 22–32)
Calcium: 7.6 mg/dL — ABNORMAL LOW (ref 8.9–10.3)
Calcium: 7.6 mg/dL — ABNORMAL LOW (ref 8.9–10.3)
Chloride: 101 mmol/L (ref 98–111)
Chloride: 99 mmol/L (ref 98–111)
Creatinine, Ser: 1.17 mg/dL (ref 0.61–1.24)
Creatinine, Ser: 1.38 mg/dL — ABNORMAL HIGH (ref 0.61–1.24)
GFR, Estimated: >60 mL/min (ref >60)
GFR, Estimated: >60 mL/min (ref >60)
Glucose, Bld: 269 mg/dL — ABNORMAL HIGH (ref 70–99)
Glucose, Bld: 255 mg/dL — ABNORMAL HIGH (ref 70–99)
Potassium: 4 mmol/L (ref 3.5–5.1)
Potassium: 4.9 mmol/L (ref 3.5–5.1)
Sodium: 132 mmol/L — ABNORMAL LOW (ref 135–145)
Sodium: 133 mmol/L — ABNORMAL LOW (ref 135–145)

## 2023-10-31 LAB — POCT I-STAT 7, (LYTES, BLD GAS, ICA,H+H)
Acid-Base Excess: 0 mmol/L (ref 0.0–2.0)
Acid-base deficit: 3 mmol/L — ABNORMAL HIGH (ref 0.0–2.0)
Acid-base deficit: 4 mmol/L — ABNORMAL HIGH (ref 0.0–2.0)
Acid-base deficit: 4 mmol/L — ABNORMAL HIGH (ref 0.0–2.0)
Acid-base deficit: 3 mmol/L — ABNORMAL HIGH (ref 0.0–2.0)
Bicarbonate: 21.4 mmol/L (ref 20.0–28.0)
Bicarbonate: 22.2 mmol/L (ref 20.0–28.0)
Bicarbonate: 22.4 mmol/L (ref 20.0–28.0)
Bicarbonate: 23.9 mmol/L (ref 20.0–28.0)
Bicarbonate: 25.1 mmol/L (ref 20.0–28.0)
Calcium, Ion: 1.11 mmol/L — ABNORMAL LOW (ref 1.15–1.40)
Calcium, Ion: 1.12 mmol/L — ABNORMAL LOW (ref 1.15–1.40)
Calcium, Ion: 1.13 mmol/L — ABNORMAL LOW (ref 1.15–1.40)
Calcium, Ion: 1.16 mmol/L (ref 1.15–1.40)
Calcium, Ion: 1.16 mmol/L (ref 1.15–1.40)
HCT: 33 % — ABNORMAL LOW (ref 39.0–52.0)
HCT: 40 % (ref 39.0–52.0)
HCT: 40 % (ref 39.0–52.0)
HCT: 41 % (ref 39.0–52.0)
HCT: 45 % (ref 39.0–52.0)
Hemoglobin: 11.2 g/dL — ABNORMAL LOW (ref 13.0–17.0)
Hemoglobin: 13.6 g/dL (ref 13.0–17.0)
Hemoglobin: 13.6 g/dL (ref 13.0–17.0)
Hemoglobin: 13.9 g/dL (ref 13.0–17.0)
Hemoglobin: 15.3 g/dL (ref 13.0–17.0)
O2 Saturation: 89 %
O2 Saturation: 91 %
O2 Saturation: 95 %
O2 Saturation: 98 %
O2 Saturation: 99 %
Patient temperature: 98.1
Patient temperature: 99.4
Potassium: 4.1 mmol/L (ref 3.5–5.1)
Potassium: 4.3 mmol/L (ref 3.5–5.1)
Potassium: 4.7 mmol/L (ref 3.5–5.1)
Potassium: 4.7 mmol/L (ref 3.5–5.1)
Potassium: 4.8 mmol/L (ref 3.5–5.1)
Sodium: 133 mmol/L — ABNORMAL LOW (ref 135–145)
Sodium: 133 mmol/L — ABNORMAL LOW (ref 135–145)
Sodium: 134 mmol/L — ABNORMAL LOW (ref 135–145)
Sodium: 134 mmol/L — ABNORMAL LOW (ref 135–145)
Sodium: 134 mmol/L — ABNORMAL LOW (ref 135–145)
TCO2: 23 mmol/L (ref 22–32)
TCO2: 23 mmol/L (ref 22–32)
TCO2: 24 mmol/L (ref 22–32)
TCO2: 25 mmol/L (ref 22–32)
TCO2: 26 mmol/L (ref 22–32)
pCO2 arterial: 37.2 mm[Hg] (ref 32–48)
pCO2 arterial: 37.7 mm[Hg] (ref 32–48)
pCO2 arterial: 41.1 mm[Hg] (ref 32–48)
pCO2 arterial: 44.7 mm[Hg] (ref 32–48)
pCO2 arterial: 48.6 mm[Hg] — ABNORMAL HIGH (ref 32–48)
pH, Arterial: 7.301 — ABNORMAL LOW (ref 7.35–7.45)
pH, Arterial: 7.31 — ABNORMAL LOW (ref 7.35–7.45)
pH, Arterial: 7.367 (ref 7.35–7.45)
pH, Arterial: 7.377 (ref 7.35–7.45)
pH, Arterial: 7.394 (ref 7.35–7.45)
pO2, Arterial: 110 mm[Hg] — ABNORMAL HIGH (ref 83–108)
pO2, Arterial: 120 mm[Hg] — ABNORMAL HIGH (ref 83–108)
pO2, Arterial: 59 mm[Hg] — ABNORMAL LOW (ref 83–108)
pO2, Arterial: 63 mm[Hg] — ABNORMAL LOW (ref 83–108)
pO2, Arterial: 87 mm[Hg] (ref 83–108)

## 2023-10-31 LAB — LACTIC ACID, PLASMA
Lactic Acid, Venous: 3.5 mmol/L (ref 0.5–1.9)
Lactic Acid, Venous: 2.1 mmol/L (ref 0.5–1.9)

## 2023-10-31 LAB — STREP PNEUMONIAE URINARY ANTIGEN: Strep Pneumo Urinary Antigen: NEGATIVE

## 2023-10-31 LAB — HIV ANTIBODY (ROUTINE TESTING W REFLEX): HIV Screen 4th Generation wRfx: NONREACTIVE

## 2023-10-31 LAB — CBC
HCT: 41.9 % (ref 39.0–52.0)
HCT: 45.6 % (ref 39.0–52.0)
Hemoglobin: 14.5 g/dL (ref 13.0–17.0)
Hemoglobin: 15.2 g/dL (ref 13.0–17.0)
MCH: 32.4 pg (ref 26.0–34.0)
MCH: 32.8 pg (ref 26.0–34.0)
MCHC: 33.3 g/dL (ref 30.0–36.0)
MCHC: 34.6 g/dL (ref 30.0–36.0)
MCV: 93.5 fL (ref 80.0–100.0)
MCV: 98.3 fL (ref 80.0–100.0)
Platelets: 280 10*3/uL (ref 150–400)
Platelets: 292 10*3/uL (ref 150–400)
RBC: 4.48 MIL/uL (ref 4.22–5.81)
RBC: 4.64 MIL/uL (ref 4.22–5.81)
RDW: 12.4 % (ref 11.5–15.5)
RDW: 12.7 % (ref 11.5–15.5)
WBC: 21.4 10*3/uL — ABNORMAL HIGH (ref 4.0–10.5)
WBC: 23.5 10*3/uL — ABNORMAL HIGH (ref 4.0–10.5)
nRBC: 0 % (ref 0.0–0.2)
nRBC: 0 % (ref 0.0–0.2)

## 2023-10-31 LAB — BRAIN NATRIURETIC PEPTIDE: B Natriuretic Peptide: 140.3 pg/mL — ABNORMAL HIGH (ref 0.0–100.0)

## 2023-10-31 LAB — MAGNESIUM
Magnesium: 1.6 mg/dL — ABNORMAL LOW (ref 1.7–2.4)
Magnesium: 2.3 mg/dL (ref 1.7–2.4)
Magnesium: 3.2 mg/dL — ABNORMAL HIGH (ref 1.7–2.4)

## 2023-10-31 LAB — PROCALCITONIN: Procalcitonin: 3.09 ng/mL

## 2023-10-31 LAB — GLUCOSE, CAPILLARY
Glucose-Capillary: 241 mg/dL — ABNORMAL HIGH (ref 70–99)
Glucose-Capillary: 268 mg/dL — ABNORMAL HIGH (ref 70–99)
Glucose-Capillary: 304 mg/dL — ABNORMAL HIGH (ref 70–99)
Glucose-Capillary: 291 mg/dL — ABNORMAL HIGH (ref 70–99)
Glucose-Capillary: 309 mg/dL — ABNORMAL HIGH (ref 70–99)
Glucose-Capillary: 327 mg/dL — ABNORMAL HIGH (ref 70–99)

## 2023-10-31 LAB — PHOSPHORUS
Phosphorus: 3.6 mg/dL (ref 2.5–4.6)
Phosphorus: 4.8 mg/dL — ABNORMAL HIGH (ref 2.5–4.6)

## 2023-10-31 MED ORDER — FENTANYL CITRATE PF 50 MCG/ML IJ SOSY: 100.0000 ug | PREFILLED_SYRINGE | Freq: Once | INTRAMUSCULAR | Status: DC

## 2023-10-31 MED ORDER — FENTANYL 2500MCG IN NS 250ML (10MCG/ML) PREMIX INFUSION
50.0000 ug/h | INTRAVENOUS | Status: DC
Start: 2023-10-31 — End: 2023-11-03
  Administered 2023-10-31: 250 ug/h via INTRAVENOUS
  Administered 2023-10-31: 50 ug/h via INTRAVENOUS
  Administered 2023-10-31 – 2023-11-01 (×3): 250 ug/h via INTRAVENOUS
  Administered 2023-11-02: 275 ug/h via INTRAVENOUS
  Administered 2023-11-02 (×2): 250 ug/h via INTRAVENOUS
  Administered 2023-11-03: 275 ug/h via INTRAVENOUS
  Filled 2023-10-31 (×9): qty 250

## 2023-10-31 MED ORDER — MELATONIN 5 MG PO TABS: 5.0000 mg | ORAL_TABLET | Freq: Every evening | ORAL | Status: DC | PRN

## 2023-10-31 MED ORDER — PROPOFOL 10 MG/ML IV BOLUS
INTRAVENOUS | Status: DC | PRN
  Administered 2023-10-31: 150 mg via INTRAVENOUS

## 2023-10-31 MED ORDER — VITAL 1.5 CAL PO LIQD: 1000.0000 mL | ORAL | Status: DC

## 2023-10-31 MED ORDER — INSULIN ASPART 100 UNIT/ML IJ SOLN
0.0000 [IU] | INTRAMUSCULAR | Status: DC
  Administered 2023-10-31 (×3): 11 [IU] via SUBCUTANEOUS
  Administered 2023-10-31: 8 [IU] via SUBCUTANEOUS
  Administered 2023-11-01: 3 [IU] via SUBCUTANEOUS
  Administered 2023-11-01: 5 [IU] via SUBCUTANEOUS
  Administered 2023-11-01 – 2023-11-02 (×7): 3 [IU] via SUBCUTANEOUS
  Administered 2023-11-03 (×3): 2 [IU] via SUBCUTANEOUS
  Administered 2023-11-03: 5 [IU] via SUBCUTANEOUS
  Administered 2023-11-03: 2 [IU] via SUBCUTANEOUS
  Administered 2023-11-03 – 2023-11-04 (×2): 5 [IU] via SUBCUTANEOUS

## 2023-10-31 MED ORDER — DEXMEDETOMIDINE HCL IN NACL 400 MCG/100ML IV SOLN: 0.0000 ug/kg/h | INTRAVENOUS | Status: DC

## 2023-10-31 MED ORDER — MIDAZOLAM HCL 2 MG/2ML IJ SOLN
2.0000 mg | INTRAMUSCULAR | Status: DC | PRN
  Administered 2023-10-31 – 2023-11-02 (×2): 2 mg via INTRAVENOUS
  Filled 2023-10-31 (×2): qty 2

## 2023-10-31 MED ORDER — FENTANYL BOLUS VIA INFUSION: 50.0000 ug | INTRAVENOUS | Status: DC | PRN

## 2023-10-31 MED ORDER — PANTOPRAZOLE SODIUM 40 MG IV SOLR
40.0000 mg | INTRAVENOUS | Status: DC
  Administered 2023-10-31 – 2023-11-03 (×4): 40 mg via INTRAVENOUS
  Filled 2023-10-31 (×4): qty 10

## 2023-10-31 MED ORDER — GUAIFENESIN 100 MG/5ML PO LIQD
10.0000 mL | Freq: Four times a day (QID) | ORAL | Status: DC
  Administered 2023-10-31 – 2023-11-03 (×13): 10 mL
  Filled 2023-10-31 (×12): qty 10

## 2023-10-31 MED ORDER — SODIUM CHLORIDE 0.9% FLUSH: 10.0000 mL | INTRAVENOUS | Status: DC | PRN

## 2023-10-31 MED ORDER — VITAL 1.5 CAL PO LIQD
1000.0000 mL | ORAL | Status: DC
  Administered 2023-10-31: 1000 mL

## 2023-10-31 MED ORDER — PROPOFOL 1000 MG/100ML IV EMUL
INTRAVENOUS | Status: AC
  Administered 2023-10-31: 5 ug/kg/min via INTRAVENOUS
  Filled 2023-10-31: qty 100

## 2023-10-31 MED ORDER — NOREPINEPHRINE 4 MG/250ML-% IV SOLN
2.0000 ug/min | INTRAVENOUS | Status: DC
  Administered 2023-10-31: 2 ug/min via INTRAVENOUS
  Filled 2023-10-31: qty 250

## 2023-10-31 MED ORDER — ADULT MULTIVITAMIN W/MINERALS CH
1.0000 | ORAL_TABLET | Freq: Every day | ORAL | Status: DC
  Administered 2023-10-31 – 2023-11-03 (×4): 1
  Filled 2023-10-31 (×4): qty 1

## 2023-10-31 MED ORDER — ACETAMINOPHEN 325 MG PO TABS
650.0000 mg | ORAL_TABLET | ORAL | Status: DC | PRN
  Filled 2023-10-31: qty 2

## 2023-10-31 MED ORDER — NOREPINEPHRINE 16 MG/250ML-% IV SOLN
0.0000 ug/min | INTRAVENOUS | Status: DC
  Administered 2023-10-31: 10 ug/min via INTRAVENOUS
  Filled 2023-10-31: qty 250

## 2023-10-31 MED ORDER — OSELTAMIVIR PHOSPHATE 6 MG/ML PO SUSR
75.0000 mg | Freq: Two times a day (BID) | ORAL | Status: DC
  Administered 2023-10-31 – 2023-11-02 (×6): 75 mg
  Filled 2023-10-31 (×8): qty 12.5

## 2023-10-31 MED ORDER — FENTANYL CITRATE PF 50 MCG/ML IJ SOSY
50.0000 ug | PREFILLED_SYRINGE | Freq: Once | INTRAMUSCULAR | Status: AC
  Administered 2023-10-31: 50 ug via INTRAVENOUS

## 2023-10-31 MED ORDER — FENTANYL BOLUS VIA INFUSION
100.0000 ug | INTRAVENOUS | Status: DC | PRN
  Administered 2023-10-31 – 2023-11-03 (×13): 100 ug via INTRAVENOUS

## 2023-10-31 MED ORDER — SODIUM CHLORIDE 0.9% FLUSH
10.0000 mL | Freq: Two times a day (BID) | INTRAVENOUS | Status: DC
  Administered 2023-10-31 – 2023-11-03 (×8): 10 mL
  Administered 2023-11-04: 20 mL
  Administered 2023-11-04 – 2023-11-05 (×2): 10 mL
  Administered 2023-11-05: 20 mL

## 2023-10-31 MED ORDER — DEXMEDETOMIDINE HCL IN NACL 400 MCG/100ML IV SOLN
INTRAVENOUS | Status: AC
  Administered 2023-10-31: 1.2 ug/kg/h via INTRAVENOUS
  Filled 2023-10-31: qty 100

## 2023-10-31 MED ORDER — DOCUSATE SODIUM 50 MG/5ML PO LIQD
100.0000 mg | Freq: Two times a day (BID) | ORAL | Status: DC
  Administered 2023-10-31 – 2023-11-02 (×6): 100 mg
  Filled 2023-10-31 (×7): qty 10

## 2023-10-31 MED ORDER — FENTANYL CITRATE PF 50 MCG/ML IJ SOSY: 100.0000 ug | PREFILLED_SYRINGE | Freq: Once | INTRAMUSCULAR | Status: AC

## 2023-10-31 MED ORDER — FENTANYL CITRATE PF 50 MCG/ML IJ SOSY
50.0000 ug | PREFILLED_SYRINGE | Freq: Once | INTRAMUSCULAR | Status: DC
  Filled 2023-10-31: qty 1

## 2023-10-31 MED ORDER — MAGNESIUM SULFATE 4 GM/100ML IV SOLN
4.0000 g | Freq: Once | INTRAVENOUS | Status: AC
  Administered 2023-10-31: 4 g via INTRAVENOUS
  Filled 2023-10-31: qty 100

## 2023-10-31 MED ORDER — INSULIN ASPART 100 UNIT/ML IJ SOLN
0.0000 [IU] | Freq: Three times a day (TID) | INTRAMUSCULAR | Status: DC
Start: 2023-10-31 — End: 2023-10-31

## 2023-10-31 MED ORDER — SODIUM CHLORIDE 0.9 % IV SOLN
250.0000 mL | INTRAVENOUS | Status: DC
  Administered 2023-10-31: 250 mL via INTRAVENOUS

## 2023-10-31 MED ORDER — SUCCINYLCHOLINE CHLORIDE 200 MG/10ML IV SOSY
PREFILLED_SYRINGE | INTRAVENOUS | Status: DC | PRN
  Administered 2023-10-31: 100 mg via INTRAVENOUS

## 2023-10-31 MED ORDER — POLYETHYLENE GLYCOL 3350 17 G PO PACK: 17.0000 g | PACK | Freq: Every day | ORAL | Status: DC | PRN

## 2023-10-31 MED ORDER — LACTATED RINGERS IV BOLUS
1000.0000 mL | Freq: Once | INTRAVENOUS | Status: AC
  Administered 2023-10-31: 1000 mL via INTRAVENOUS

## 2023-10-31 MED ORDER — PROSOURCE TF20 ENFIT COMPATIBL EN LIQD
60.0000 mL | Freq: Every day | ENTERAL | Status: DC
  Administered 2023-10-31 – 2023-11-02 (×3): 60 mL
  Filled 2023-10-31 (×3): qty 60

## 2023-10-31 MED ORDER — FAMOTIDINE 20 MG PO TABS: 20.0000 mg | ORAL_TABLET | Freq: Two times a day (BID) | ORAL | Status: DC

## 2023-10-31 MED ORDER — POLYETHYLENE GLYCOL 3350 17 G PO PACK
17.0000 g | PACK | Freq: Every day | ORAL | Status: DC
  Administered 2023-10-31 – 2023-11-03 (×4): 17 g
  Filled 2023-10-31 (×4): qty 1

## 2023-10-31 MED ORDER — PROPOFOL 1000 MG/100ML IV EMUL
5.0000 ug/kg/min | INTRAVENOUS | Status: DC
  Administered 2023-10-31 (×3): 40 ug/kg/min via INTRAVENOUS
  Administered 2023-11-01: 45 ug/kg/min via INTRAVENOUS
  Administered 2023-11-01 (×2): 40 ug/kg/min via INTRAVENOUS
  Administered 2023-11-01: 45 ug/kg/min via INTRAVENOUS
  Administered 2023-11-01 (×2): 40 ug/kg/min via INTRAVENOUS
  Administered 2023-11-02 (×2): 50 ug/kg/min via INTRAVENOUS
  Administered 2023-11-02: 40 ug/kg/min via INTRAVENOUS
  Administered 2023-11-02 – 2023-11-03 (×4): 50 ug/kg/min via INTRAVENOUS
  Filled 2023-10-31 (×18): qty 100

## 2023-10-31 NOTE — Progress Notes (Signed)
 [facility] Physician-Brief Progress Note Patient Name: Dale Clark DOB: May 05, 1976 MRN: [id number]   Date of Service  10/31/2023  HPI/Events of Note  ABG 7.36/37/63 on PRVC 28/580/100%/12 PEEP PEEP increased to 14 Plateau pressure 29-30 Sedated on propofol  5 and Fentanyl  100 BP 82/48  eICU Interventions  Norepinephrine  to be started as previously ordered. Will need a central line if persistently requiring pressors. Discussed with BSRN Lung protective vent settings. Target TV 6 cc /kg IBW. Discussed with BSRN     Intervention Category Intermediate Interventions: Diagnostic test evaluation;Hypotension - evaluation and management  Dale Clark 10/31/2023, 4:09 AM

## 2023-10-31 NOTE — Progress Notes (Signed)
 [facility] Physician-Brief Progress Note Patient Name: Dale Clark DOB: [birth year] MRN: [id number]   Date of Service  10/31/2023  HPI/Events of Note  Lactate 3.5 from 2.1 No increase in pressor requirement Still with adequate urine output  eICU Interventions  Lactic acidosis likely from hypoxemia Fluid restriction due to ARDS     Intervention Category Intermediate Interventions: Diagnostic test evaluation  [name] 10/31/2023, 5:52 AM

## 2023-10-31 NOTE — Progress Notes (Signed)
 [facility] ADULT ICU REPLACEMENT PROTOCOL   The patient does apply for the [facility] Adult ICU Electrolyte Replacment Protocol based on the criteria listed below:   1.Exclusion criteria: TCTS, ECMO, Dialysis, and Myasthenia Gravis patients 2. Is GFR >/= 30 ml/min? Yes.    Patient's GFR today is >60 3. Is SCr </= 2? Yes.   Patient's SCr is 1.17 mg/dL 4. Did SCr increase >/= 0.5 in 24 hours? No. 5.Pt's weight >40kg  Yes.   6. Abnormal electrolyte(s): Mag 1.6  7. Electrolytes replaced per protocol 8.  Call MD STAT for K+ </= 2.5, Phos </= 1, or Mag </= 1 Physician:  Dr. Marion Rummer, [place] ORN 10/31/2023 1:55 AM

## 2023-10-31 NOTE — Progress Notes (Signed)
 Peripherally Inserted Central Catheter Placement  The IV Nurse has discussed with the patient and/or persons authorized to consent for the patient, the purpose of this procedure and the potential benefits and risks involved with this procedure.  The benefits include less needle sticks, lab draws from the catheter, and the patient may be discharged home with the catheter. Risks include, but not limited to, infection, bleeding, blood clot (thrombus formation), and puncture of an artery; nerve damage and irregular heartbeat and possibility to perform a PICC exchange if needed/ordered by physician.  Alternatives to this procedure were also discussed.  Bard Power PICC patient education guide, fact sheet on infection prevention and patient information card has been provided to patient /or left at bedside.    PICC Placement Documentation  PICC Triple Lumen 10/31/23 Left Basilic 49 cm 1 cm (Active)  Indication for Insertion or Continuance of Line Vasoactive infusions;Limited venous access - need for IV therapy >5 days (PICC only) 10/31/23 1319  Exposed Catheter (cm) 1 cm 10/31/23 1319  Site Assessment Clean, Dry, Intact 10/31/23 1319  Lumen #1 Status Flushed;Saline locked;Blood return noted 10/31/23 1319  Lumen #2 Status Flushed;Saline locked;Blood return noted 10/31/23 1319  Lumen #3 Status Flushed;Saline locked;Blood return noted 10/31/23 1319  Dressing Type Transparent;Securing device 10/31/23 1319  Dressing Status Antimicrobial disc/dressing in place;Clean, Dry, Intact 10/31/23 1319  Line Care Connections checked and tightened 10/31/23 1319  Line Adjustment (NICU/IV Team Only) No 10/31/23 1319  Dressing Intervention New dressing;Adhesive placed at insertion site (IV team only);Other (Comment) 10/31/23 1319  Dressing Change Due 11/07/23 10/31/23 1319    Patient's spouse, Dale Clark, signed consent at bedside prior to PICC insertion.   [name] 10/31/2023, 1:21 PM

## 2023-10-31 NOTE — Progress Notes (Addendum)
 PCCM called to bedside. Patient w/ worsening resp distress on bipap breathing 40-50 b/m. Put out about 1.4 liters UOP w/ lasix . Sats 91% on 100% fio2 bipap. CXR appears worse than prior. Patient also w/ delirium. Called anesthesia and patient intubated.  Plan: -CXR ordered -LTVV strategy with tidal volumes of 6-8 cc/kg ideal body weight -check ABG and adjust settings accordingly -Goal plateau pressures less than 30 and driving pressures less than 15 -Wean PEEP/FiO2 for SpO2 >92% -VAP bundle in place -Daily SAT and SBT -PAD protocol in place -send trach aspirate  Critical care time: 35 minutes  JD Emilio RIGGERS [facility] 10/31/2023, 2:38 AM  Please see [email or web address] for pager details.  From 7A-7P if no response, please call [phone]. After hours, please call [facility] [phone].

## 2023-10-31 NOTE — Progress Notes (Signed)
 Initial Nutrition Assessment  DOCUMENTATION CODES:  Not applicable  INTERVENTION:  Initiate tube feeding via OGT: Vital 1.5 at 65 ml/h (1560 ml per day) Start at 25 and hold. When ok per CCM, advance by 10mL q8h to goal Prosource TF20 60 ml 1x/d Provides 2420 kcal, 125 gm protein, 1192 ml free water daily Continue MVI, thiamine , and folic acid   NUTRITION DIAGNOSIS:  Increased nutrient needs related to acute illness as evidenced by estimated needs.  GOAL:  Patient will meet greater than or equal to 90% of their needs  MONITOR:  TF tolerance, Vent status, Labs, Weight trends  REASON FOR ASSESSMENT:  Consult Enteral/tube feeding initiation and management  ASSESSMENT:  Pt with hx of DM type 2, tobacco use, and HTN presented to ED with SOB and general malaise. Found to be positive for Flu A  1/12 - presented to ED at [facility], transferred to [facility] ICU 1/13 - respiratory decline, intubated   Patient is currently intubated on ventilator support. OGT in place and terminates in the stomach on XR. Wife at bedside able to provide a history. States that pt has a small appetite at baseline. Typically eats 2 meals a day (lunch: sandwich, with fruit and chips; dinner: prepare at home, i.e. a meat and sides). Wife has not noticed any changes in weight recently and thinks pt generally weighs ~ 200 lbs. Does state that pt's last meal was Saturday as he has not been feeling well. Appears well nourished on exam.  OGT in place. Pt on pressor support x 1. Discussed in rounds, ok to start feeds. MD requests trickles and will advance as pt achieves hemodynamic stability. Discussed plan with RN.   MV: 12.9 L/min Temp (24hrs), Avg:99.4 F (37.4 C), Min:98.1 F (36.7 C), Max:102.1 F (38.9 C) MAP (Art Line) 68 mmHg Propofol : 2.72 ml/hr  Admit weight: 90.7 kg ? Accuracy, no recent weight hx  Current weight: 84.1 kg    Intake/Output Summary (Last 24 hours) at 10/31/2023 1414 Last  data filed at 10/31/2023 1400 Gross per 24 hour  Intake 4496.04 ml  Output 1200 ml  Net 3296.04 ml  Net IO Since Admission: 3,296.04 mL [10/31/23 1414]  Drains/Lines: OGT (gastric) Art Line UOP since admission  Nutritionally Relevant Medications: Scheduled Meds:  docusate  100 mg Per Tube BID   folic acid   1 mg Intravenous Daily   insulin  aspart  0-15 Units Subcutaneous TID WC   multivitamin with minerals  1 tablet Per Tube Daily   pantoprazole  (PROTONIX ) IV  40 mg Intravenous Q24H   polyethylene glycol  17 g Per Tube Daily   thiamine  (VITAMIN B1) injection  100 mg Intravenous Daily   Continuous Infusions:  azithromycin      cefTRIAXone  (ROCEPHIN )  IV     fentaNYL  infusion INTRAVENOUS 200 mcg/hr (10/31/23 0905)   norepinephrine  (LEVOPHED ) Adult infusion 4 mcg/min (10/31/23 0440)   propofol  (DIPRIVAN ) infusion 5 mcg/kg/min (10/31/23 0307)   PRN Meds: polyethylene glycol  Labs Reviewed: Na 133 Creatinine 1.38 CBG ranges from 216-268 mg/dL over the last 24 hours HgbA1c 8.4% (1/12)  NUTRITION - FOCUSED PHYSICAL EXAM: Flowsheet Row Most Recent Value  Orbital Region No depletion  Upper Arm Region No depletion  Thoracic and Lumbar Region No depletion  Buccal Region No depletion  Temple Region No depletion  Clavicle Bone Region No depletion  Clavicle and Acromion Bone Region No depletion  Scapular Bone Region No depletion  Dorsal Hand No depletion  Patellar Region No depletion  Anterior Thigh Region No depletion  Posterior Calf Region No depletion  Edema (RD Assessment) None  Hair Reviewed  Eyes Reviewed  Mouth Reviewed  Skin Reviewed  Nails Reviewed   Diet Order:   Diet Order             Diet NPO time specified  Diet effective now                   EDUCATION NEEDS:  Not appropriate for education at this time  Skin:  Skin Assessment: Reviewed RN Assessment  Last BM:  1/11  Height:  Ht Readings from Last 1 Encounters:  10/30/23 5' 10 (1.778  m)    Weight:  Wt Readings from Last 1 Encounters:  10/31/23 84.1 kg    Ideal Body Weight:  75.5 kg  BMI:  Body mass index is 26.6 kg/m.  Estimated Nutritional Needs:  Kcal:  2300-2500 kcal/d Protein:  115-130g/d Fluid:  2.3-2.5L/d    Dale Clark, RD, LDN Registered Dietitian II Please reach out via secure chat Weekend on-call pager # available in [facility]

## 2023-10-31 NOTE — Progress Notes (Signed)
 STAT order to start precedex at 1.2 per NP: JD. Emergency initiation for tachypnea/ agitation
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
## 2023-10-31 NOTE — Progress Notes (Signed)
 Extensive hidradenitis suppurativa noted to underarms, genitals, sacrum , junction of suboccipital and neck. Multiple open areas with pus drainage. Informed Elink MD

## 2023-10-31 NOTE — Plan of Care (Signed)
  Problem: Education: Goal: Knowledge of General Education information will improve Description: Including pain rating scale, medication(s)/side effects and non-pharmacologic comfort measures Outcome: Progressing   Problem: Health Behavior/Discharge Planning: Goal: Ability to manage health-related needs will improve Outcome: Progressing   Problem: Clinical Measurements: Goal: Ability to maintain clinical measurements within normal limits will improve Outcome: Progressing Goal: Will remain free from infection Outcome: Progressing Goal: Diagnostic test results will improve Outcome: Progressing Goal: Respiratory complications will improve Outcome: Progressing Goal: Cardiovascular complication will be avoided Outcome: Progressing   Problem: Activity: Goal: Risk for activity intolerance will decrease Outcome: Progressing   Problem: Nutrition: Goal: Adequate nutrition will be maintained Outcome: Progressing   Problem: Coping: Goal: Level of anxiety will decrease Outcome: Progressing   Problem: Elimination: Goal: Will not experience complications related to bowel motility Outcome: Progressing Goal: Will not experience complications related to urinary retention Outcome: Progressing   Problem: Pain Management: Goal: General experience of comfort will improve Outcome: Progressing   Problem: Safety: Goal: Ability to remain free from injury will improve Outcome: Progressing   Problem: Skin Integrity: Goal: Risk for impaired skin integrity will decrease Outcome: Progressing   Problem: Activity: Goal: Ability to tolerate increased activity will improve Outcome: Progressing   Problem: Respiratory: Goal: Ability to maintain a clear airway and adequate ventilation will improve Outcome: Progressing   Problem: Role Relationship: Goal: Method of communication will improve Outcome: Progressing

## 2023-10-31 NOTE — Progress Notes (Signed)
Sputum culture collected and sent to the lab. 

## 2023-10-31 NOTE — Progress Notes (Signed)
 eLink Physician-Brief Progress Note Patient Name: Dale Clark DOB: 1976-01-05 MRN: [id number]   Date of Service  10/31/2023  HPI/Events of Note  Notified of persistent increased work of breathing.  eICU Interventions  Bedside CCM informed and will assess patient     Intervention Category Intermediate Interventions: Respiratory distress - evaluation and management  Damien DASEN Cantrell Martus 10/31/2023, 2:02 AM

## 2023-10-31 NOTE — Anesthesia Procedure Notes (Signed)
 Procedure Name: Intubation Date/Time: 10/31/2023 2:31 AM  Performed by: Jama Powell NOVAK, CRNAPre-anesthesia Checklist: Patient identified, Emergency Drugs available, Suction available, Patient being monitored and Timeout performed Patient Re-evaluated:Patient Re-evaluated prior to induction Oxygen Delivery Method: Circle system utilized Preoxygenation: Pre-oxygenation with 100% oxygen Induction Type: IV induction and Rapid sequence Laryngoscope Size: Glidescope and 4 Grade View: Grade I Tube type: Oral Tube size: 7.5 mm Number of attempts: 1 Airway Equipment and Method: Rigid stylet and Video-laryngoscopy Placement Confirmation: breath sounds checked- equal and bilateral, CO2 detector, positive ETCO2 and ETT inserted through vocal cords under direct vision Secured at: 24 cm Tube secured with: Tape Dental Injury: Teeth and Oropharynx as per pre-operative assessment

## 2023-10-31 NOTE — Progress Notes (Signed)
 Lactic Acid 3.5 called into Elink

## 2023-10-31 NOTE — Plan of Care (Signed)
  Problem: Health Behavior/Discharge Planning: Goal: Ability to manage health-related needs will improve Outcome: Progressing   Problem: Clinical Measurements: Goal: Diagnostic test results will improve Outcome: Progressing   Problem: Nutrition: Goal: Adequate nutrition will be maintained Outcome: Progressing

## 2023-10-31 NOTE — TOC Initial Note (Signed)
 Transition of Care [facility]) - Initial/Assessment Note    Patient Details  Name: Dale Clark MRN: [id number] Date of Birth: 10/12/1976  Transition of [facility]) CM/SW Contact:    Lauraine FORBES Saa, LCSW Phone Number: 10/31/2023, 2:47 PM   Clinical Narrative:  2:48 PM Per chart review, patient is currently intubated and has an active [facility] consult for Substance use education/counseling. TOC will continue to follow.  Transition of Care Asessment: Insurance and Status: Insurance coverage has been reviewed Patient has primary care physician: Yes Home environment has been reviewed: Private Residence   Prior/Current Home Services: No current home services Social Drivers of Health Review:  (Patient currently unable to answer) Readmission risk has been reviewed: Yes Transition of care needs: transition of care needs identified, TOC will continue to follow   Admission diagnosis:  ARDS (adult respiratory distress syndrome) (HCC) [J80] Hyponatremia [E87.1] Influenza A [J10.1] Acute respiratory failure with hypoxia (HCC) [J96.01] Acute respiratory failure with hypoxemia (HCC) [J96.01] Patient Active Problem List   Diagnosis Date Noted   ARDS (adult respiratory distress syndrome) (HCC) 10/30/2023   Acute respiratory failure with hypoxia (HCC) 10/30/2023   Acute respiratory failure with hypoxemia (HCC) 10/30/2023   Severe sepsis (HCC) 10/30/2023   Allergic reaction 11/30/2018   Allergic urticaria 11/30/2018   Angioedema 11/30/2018   GIB (gastrointestinal bleeding) 07/04/2015   Anemia 07/04/2015   Supratherapeutic INR 07/04/2015   Leukocytosis 12/31/2013   Hypertension 04/04/2008   Other forms of dyspnea 04/04/2008   PCP:  Rosalea Rosina SAILOR, PA Pharmacy:   [id number] - [place], [place] - [address] [place] [address] Phone: [phone] Fax: [phone]  [facility] [place] (New Address) - [place], [place] - [address] AT Previously: Viviana Mulligan, [place] [address] [place] [address] Phone: [phone] Fax: [phone]  [id number] - [place], [place] - [address] AT [facility] [address] [place] [address] Phone: [phone] Fax: [phone]  [id number] - [place],  - [address] AT [facility] [address] [place] [address] Phone: [phone] Fax: [phone]     Social Drivers of Health (SDOH) Social History: SDOH Screenings   Food Insecurity: No Food Insecurity (08/23/2023)   Received from [facility]  Transportation Needs: No Transportation Needs (08/23/2023)   Received from [facility]  Utilities: Not At Risk (08/23/2023)   Received from [facility]  Financial Resource Strain: Low Risk  (08/23/2023)   Received from [facility]  Social Connections: Unknown (02/19/2022)   Received from [facility]  Tobacco Use: High Risk (10/30/2023)   SDOH Interventions:     Readmission Risk Interventions     No data to display

## 2023-10-31 NOTE — Inpatient Diabetes Management (Signed)
 Inpatient Diabetes Program Recommendations  AACE/ADA: New Consensus Statement on Inpatient Glycemic Control (2015)  Target Ranges:  Prepandial:   less than 140 mg/dL      Peak postprandial:   less than 180 mg/dL (1-2 hours)      Critically ill patients:  140 - 180 mg/dL   Lab Results  Component Value Date   GLUCAP 268 (H) 10/31/2023   HGBA1C 8.4 (H) 10/30/2023    Latest Reference Range & Units 10/30/23 19:42 10/30/23 21:06 10/30/23 23:05 10/31/23 03:10 10/31/23 07:37  Glucose-Capillary 70 - 99 mg/dL 742 (H) Novolog  5 units 216 (H) 253 (H) Novolog  5 units 241 (H) Novolog  3 units 268 (H) Novolog  5 units  (H): Data is abnormally high  Diabetes history: DM2 Outpatient Diabetes medications: Metformin  500 mg bid (not taking) Current orders for Inpatient glycemic control: Novolog  0-15 units tid, Solumedrol 125 mg x 1 10/30/23  Inpatient Diabetes Program Recommendations:   Please consider: -Add Semglee 12 units daily (0.15 units/kg x 84.1 kg) -Change Novolog  correction to q 4 hrs. While NPO.  Thank you, Jassiah Viviano E. Marelin Tat, RN, MSN, CDCES  Diabetes Coordinator Inpatient Glycemic Control Team Team Pager [phone] (8am-5pm) 10/31/2023 9:48 AM

## 2023-10-31 NOTE — Procedures (Signed)
 Arterial Catheter Insertion Procedure Note  Dale Clark  [id number]  [birth year]  Date:10/31/23  Time:3:49 AM    Provider Performing: Darold LITTIE Sharps, BS, RRT-ACCS, RCP   Procedure: Insertion of Arterial Line (63379) without US  guidance  Indication(s) Blood pressure monitoring and/or need for frequent ABGs  Consent Unable to obtain consent due to emergent nature of procedure.  Anesthesia None   Time Out Verified patient identification, verified procedure, site/side was marked, verified correct patient position, special equipment/implants available, medications/allergies/relevant history reviewed, required imaging and test results available.   Sterile Technique Maximal sterile technique including full sterile barrier drape, hand hygiene, sterile gown, sterile gloves, mask, hair covering, sterile ultrasound probe cover (if used).   Procedure Description Area of catheter insertion was cleaned with chlorhexidine  and draped in sterile fashion. Without real-time ultrasound guidance an arterial catheter was placed into the right radial artery.  Appropriate arterial tracings confirmed on monitor.     Complications/Tolerance None; patient tolerated the procedure well.   EBL Minimal  Dale Clark L. Sharps, BS, RRT-ACCS, RCP

## 2023-10-31 NOTE — Progress Notes (Signed)
 Progressively worsening respiratory rate. Labored breathing. Current rate 45, given 2mg  of ativan for CIWA 11.

## 2023-11-01 DIAGNOSIS — J80 Acute respiratory distress syndrome: Secondary | ICD-10-CM | POA: Diagnosis not present

## 2023-11-01 LAB — CBC
HCT: 32.5 % — ABNORMAL LOW (ref 39.0–52.0)
Hemoglobin: 11 g/dL — ABNORMAL LOW (ref 13.0–17.0)
MCH: 32.9 pg (ref 26.0–34.0)
MCHC: 33.8 g/dL (ref 30.0–36.0)
MCV: 97.3 fL (ref 80.0–100.0)
Platelets: 194 10*3/uL (ref 150–400)
RBC: 3.34 MIL/uL — ABNORMAL LOW (ref 4.22–5.81)
RDW: 12.8 % (ref 11.5–15.5)
WBC: 14.4 10*3/uL — ABNORMAL HIGH (ref 4.0–10.5)
nRBC: 0 % (ref 0.0–0.2)

## 2023-11-01 LAB — BASIC METABOLIC PANEL
Anion gap: 7 (ref 5–15)
BUN: 20 mg/dL (ref 6–20)
CO2: 24 mmol/L (ref 22–32)
Calcium: 7.7 mg/dL — ABNORMAL LOW (ref 8.9–10.3)
Chloride: 106 mmol/L (ref 98–111)
Creatinine, Ser: 1.33 mg/dL — ABNORMAL HIGH (ref 0.61–1.24)
GFR, Estimated: >60 mL/min (ref >60)
Glucose, Bld: 171 mg/dL — ABNORMAL HIGH (ref 70–99)
Potassium: 4.3 mmol/L (ref 3.5–5.1)
Sodium: 137 mmol/L (ref 135–145)

## 2023-11-01 LAB — PHOSPHORUS
Phosphorus: 2.9 mg/dL (ref 2.5–4.6)
Phosphorus: 2.4 mg/dL — ABNORMAL LOW (ref 2.5–4.6)

## 2023-11-01 LAB — POCT I-STAT 7, (LYTES, BLD GAS, ICA,H+H)
Acid-Base Excess: 2 mmol/L (ref 0.0–2.0)
Bicarbonate: 29.2 mmol/L — ABNORMAL HIGH (ref 20.0–28.0)
Calcium, Ion: 1.23 mmol/L (ref 1.15–1.40)
HCT: 33 % — ABNORMAL LOW (ref 39.0–52.0)
Hemoglobin: 11.2 g/dL — ABNORMAL LOW (ref 13.0–17.0)
O2 Saturation: 92 %
Potassium: 4.3 mmol/L (ref 3.5–5.1)
Sodium: 139 mmol/L (ref 135–145)
TCO2: 31 mmol/L (ref 22–32)
pCO2 arterial: 59.1 mm[Hg] — ABNORMAL HIGH (ref 32–48)
pH, Arterial: 7.302 — ABNORMAL LOW (ref 7.35–7.45)
pO2, Arterial: 72 mm[Hg] — ABNORMAL LOW (ref 83–108)

## 2023-11-01 LAB — MAGNESIUM
Magnesium: 2.8 mg/dL — ABNORMAL HIGH (ref 1.7–2.4)
Magnesium: 2.5 mg/dL — ABNORMAL HIGH (ref 1.7–2.4)

## 2023-11-01 LAB — LEGIONELLA PNEUMOPHILA SEROGP 1 UR AG: L. pneumophila Serogp 1 Ur Ag: NEGATIVE

## 2023-11-01 LAB — GLUCOSE, CAPILLARY
Glucose-Capillary: 177 mg/dL — ABNORMAL HIGH (ref 70–99)
Glucose-Capillary: 191 mg/dL — ABNORMAL HIGH (ref 70–99)
Glucose-Capillary: 208 mg/dL — ABNORMAL HIGH (ref 70–99)
Glucose-Capillary: 171 mg/dL — ABNORMAL HIGH (ref 70–99)
Glucose-Capillary: 100 mg/dL — ABNORMAL HIGH (ref 70–99)

## 2023-11-01 LAB — TRIGLYCERIDES: Triglycerides: 128 mg/dL (ref <150)

## 2023-11-01 MED ORDER — ORAL CARE MOUTH RINSE
15.0000 mL | OROMUCOSAL | Status: DC
  Administered 2023-11-01 – 2023-11-03 (×27): 15 mL via OROMUCOSAL

## 2023-11-01 MED ORDER — INSULIN ASPART 100 UNIT/ML IJ SOLN
4.0000 [IU] | INTRAMUSCULAR | Status: DC
  Administered 2023-11-01 – 2023-11-02 (×9): 4 [IU] via SUBCUTANEOUS

## 2023-11-01 MED ORDER — THIAMINE MONONITRATE 100 MG PO TABS
100.0000 mg | ORAL_TABLET | Freq: Every day | ORAL | Status: DC
  Administered 2023-11-02 – 2023-11-03 (×2): 100 mg
  Filled 2023-11-01 (×2): qty 1

## 2023-11-01 MED ORDER — FOLIC ACID 1 MG PO TABS
1.0000 mg | ORAL_TABLET | Freq: Every day | ORAL | Status: DC
  Administered 2023-11-02 – 2023-11-03 (×2): 1 mg
  Filled 2023-11-01 (×2): qty 1

## 2023-11-01 MED ORDER — ORAL CARE MOUTH RINSE: 15.0000 mL | OROMUCOSAL | Status: DC | PRN

## 2023-11-01 MED ORDER — VITAL 1.5 CAL PO LIQD
1000.0000 mL | ORAL | Status: DC
  Administered 2023-11-01 – 2023-11-02 (×2): 1000 mL
  Filled 2023-11-01: qty 1000

## 2023-11-01 NOTE — Progress Notes (Signed)
 eLink Physician-Brief Progress Note Patient Name: Mert Dietrick DOB: 1976-02-22 MRN: [id number]   Date of Service  11/01/2023  HPI/Events of Note  48 year old admitted with respiratory distress and hypoxemia with ARDS.  Has influenza and an acute kidney injury.  Bladder scan greater than 633  eICU Interventions  Follow standard retention guidelines.  In-N-Out cath as needed     Intervention Category Major Interventions: Respiratory failure - evaluation and management  Rihanna Marseille 11/01/2023, 12:52 AM

## 2023-11-01 NOTE — Plan of Care (Signed)
  Problem: Nutrition: Goal: Adequate nutrition will be maintained Outcome: Progressing   Problem: Coping: Goal: Level of anxiety will decrease Outcome: Progressing   Problem: Pain Management: Goal: General experience of comfort will improve Outcome: Progressing   Problem: Elimination: Goal: Will not experience complications related to urinary retention Outcome: Not Progressing
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
## 2023-11-02 DIAGNOSIS — J80 Acute respiratory distress syndrome: Secondary | ICD-10-CM | POA: Diagnosis not present

## 2023-11-02 LAB — PHOSPHORUS: Phosphorus: 2.4 mg/dL — ABNORMAL LOW (ref 2.5–4.6)

## 2023-11-02 LAB — CBC
HCT: 33.8 % — ABNORMAL LOW (ref 39.0–52.0)
Hemoglobin: 11 g/dL — ABNORMAL LOW (ref 13.0–17.0)
MCH: 32.7 pg (ref 26.0–34.0)
MCHC: 32.5 g/dL (ref 30.0–36.0)
MCV: 100.6 fL — ABNORMAL HIGH (ref 80.0–100.0)
Platelets: 182 10*3/uL (ref 150–400)
RBC: 3.36 MIL/uL — ABNORMAL LOW (ref 4.22–5.81)
RDW: 13 % (ref 11.5–15.5)
WBC: 11.7 10*3/uL — ABNORMAL HIGH (ref 4.0–10.5)
nRBC: 0 % (ref 0.0–0.2)

## 2023-11-02 LAB — BASIC METABOLIC PANEL
Anion gap: 8 (ref 5–15)
BUN: 19 mg/dL (ref 6–20)
CO2: 26 mmol/L (ref 22–32)
Calcium: 7.9 mg/dL — ABNORMAL LOW (ref 8.9–10.3)
Chloride: 105 mmol/L (ref 98–111)
Creatinine, Ser: 1.06 mg/dL (ref 0.61–1.24)
GFR, Estimated: >60 mL/min (ref >60)
Glucose, Bld: 200 mg/dL — ABNORMAL HIGH (ref 70–99)
Potassium: 4.1 mmol/L (ref 3.5–5.1)
Sodium: 139 mmol/L (ref 135–145)

## 2023-11-02 LAB — CULTURE, RESPIRATORY W GRAM STAIN

## 2023-11-02 LAB — GLUCOSE, CAPILLARY
Glucose-Capillary: 175 mg/dL — ABNORMAL HIGH (ref 70–99)
Glucose-Capillary: 176 mg/dL — ABNORMAL HIGH (ref 70–99)
Glucose-Capillary: 181 mg/dL — ABNORMAL HIGH (ref 70–99)
Glucose-Capillary: 185 mg/dL — ABNORMAL HIGH (ref 70–99)
Glucose-Capillary: 196 mg/dL — ABNORMAL HIGH (ref 70–99)
Glucose-Capillary: 95 mg/dL (ref 70–99)

## 2023-11-02 LAB — MAGNESIUM: Magnesium: 2.3 mg/dL (ref 1.7–2.4)

## 2023-11-02 MED ORDER — IPRATROPIUM-ALBUTEROL 0.5-2.5 (3) MG/3ML IN SOLN
3.0000 mL | Freq: Four times a day (QID) | RESPIRATORY_TRACT | Status: DC
  Administered 2023-11-02 – 2023-11-05 (×11): 3 mL via RESPIRATORY_TRACT
  Filled 2023-11-02 (×11): qty 3

## 2023-11-02 MED ORDER — VITAL 1.5 CAL PO LIQD
1000.0000 mL | ORAL | Status: DC
  Administered 2023-11-02: 1000 mL

## 2023-11-02 MED ORDER — FUROSEMIDE 10 MG/ML IJ SOLN
40.0000 mg | Freq: Once | INTRAMUSCULAR | Status: AC
  Administered 2023-11-02: 40 mg via INTRAVENOUS
  Filled 2023-11-02: qty 4

## 2023-11-02 MED ORDER — POTASSIUM & SODIUM PHOSPHATES 280-160-250 MG PO PACK
2.0000 | PACK | ORAL | Status: AC
  Administered 2023-11-02 (×2): 2
  Filled 2023-11-02 (×2): qty 2

## 2023-11-02 MED ORDER — BETHANECHOL CHLORIDE 10 MG PO TABS
10.0000 mg | ORAL_TABLET | Freq: Three times a day (TID) | ORAL | Status: DC
  Administered 2023-11-02 – 2023-11-03 (×4): 10 mg
  Filled 2023-11-02 (×5): qty 1

## 2023-11-02 MED ORDER — SENNA 8.6 MG PO TABS
2.0000 | ORAL_TABLET | Freq: Every day | ORAL | Status: DC
  Administered 2023-11-02: 17.2 mg
  Filled 2023-11-02: qty 2

## 2023-11-02 NOTE — Plan of Care (Signed)
  Problem: Activity: Goal: Ability to tolerate increased activity will improve Outcome: Progressing   Problem: Respiratory: Goal: Ability to maintain a clear airway and adequate ventilation will improve Outcome: Progressing   

## 2023-11-02 NOTE — Progress Notes (Signed)
 Pharmacy ICU Bowel Regimen Consult Note   Current Inpatient Medications for Bowel Management:  Docusate 100 mg BID + Polyethylene glycol 17 gm daily  Assessment: Dale Clark is a 48 y.o. year old male admitted on 10/30/2023. Constipation identified as acute opioid-induced constipation . Bowel regimen assessment completed by Dale Moss, RN on 11/02/23. LBM on 10/30/23.  [x]  Bowel sounds present  []  No abdominal tenderness  [x]  Passing gas   Plan: Start Docusate 100 mg BID + Polyethylene glycol 17 gm daily + Senna 2 tabs at bedtime  MD contacted (if needed): Dr. Marygrace Snellen  Thank you for allowing pharmacy to participate in this patient's care.  Volney Grumbles, PharmD PGY-1 Acute Care Pharmacy Resident 11/02/2023 10:50 AM

## 2023-11-02 NOTE — Progress Notes (Signed)
 eLink Physician-Brief Progress Note Patient Name: Dale Clark DOB: [birth year] MRN: [id number]   Date of Service  11/02/2023  HPI/Events of Note  Aline no longer functioning to draw blood, pressures stable  eICU Interventions  DC dysfunctional line     Intervention Category Minor Interventions: Routine modifications to care plan (e.g. PRN medications for pain, fever)  Tiffony Kite 11/02/2023, 4:55 AM

## 2023-11-02 NOTE — Progress Notes (Signed)
 Sedation decreased to assist with weaning on the ventilator

## 2023-11-02 NOTE — Progress Notes (Signed)
 Pt on wean following simple commands opens yes wiggles toes when ask. Pt coughed up large amt of this tan secretions see assessment. No change in lung sounds. Pt meds given and back on full support. Reassured family that it was good to cough up secretions. Mother requested that respiratory come to bed side. RT notified will come to assess.

## 2023-11-02 NOTE — Progress Notes (Signed)
 eLink Physician-Brief Progress Note Patient Name: Dale Clark DOB: [birth year] MRN: [id number]   Date of Service  11/02/2023  HPI/Events of Note  pt is maxed on propofol  at 50mcgs, increasing the fentynl, has given boluses and versed .Pt still awake and restless at times  eICU Interventions  Increase propofol  ceiling to 80  Obtain chest x-ray in the morning for NG tube position, Nurse turned tube feeds off at the moment due to the type of secretions noted in pt's mouth.     Intervention Category Major Interventions: Delirium, psychosis, severe agitation - evaluation and management  Frank Novelo V. Demarko Zeimet 11/02/2023, 11:05 PM
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
## 2023-11-03 ENCOUNTER — Inpatient Hospital Stay (HOSPITAL_COMMUNITY): Payer: No Typology Code available for payment source

## 2023-11-03 DIAGNOSIS — J80 Acute respiratory distress syndrome: Secondary | ICD-10-CM | POA: Diagnosis not present

## 2023-11-03 LAB — GLUCOSE, CAPILLARY
Glucose-Capillary: 104 mg/dL — ABNORMAL HIGH (ref 70–99)
Glucose-Capillary: 121 mg/dL — ABNORMAL HIGH (ref 70–99)
Glucose-Capillary: 136 mg/dL — ABNORMAL HIGH (ref 70–99)
Glucose-Capillary: 142 mg/dL — ABNORMAL HIGH (ref 70–99)
Glucose-Capillary: 143 mg/dL — ABNORMAL HIGH (ref 70–99)
Glucose-Capillary: 212 mg/dL — ABNORMAL HIGH (ref 70–99)
Glucose-Capillary: 234 mg/dL — ABNORMAL HIGH (ref 70–99)

## 2023-11-03 LAB — CBC
HCT: 33.1 % — ABNORMAL LOW (ref 39.0–52.0)
Hemoglobin: 10.8 g/dL — ABNORMAL LOW (ref 13.0–17.0)
MCH: 32.5 pg (ref 26.0–34.0)
MCHC: 32.6 g/dL (ref 30.0–36.0)
MCV: 99.7 fL (ref 80.0–100.0)
Platelets: 191 10*3/uL (ref 150–400)
RBC: 3.32 MIL/uL — ABNORMAL LOW (ref 4.22–5.81)
RDW: 13.1 % (ref 11.5–15.5)
WBC: 13.2 10*3/uL — ABNORMAL HIGH (ref 4.0–10.5)
nRBC: 0 % (ref 0.0–0.2)

## 2023-11-03 LAB — BASIC METABOLIC PANEL
Anion gap: 10 (ref 5–15)
BUN: 18 mg/dL (ref 6–20)
CO2: 27 mmol/L (ref 22–32)
Calcium: 8 mg/dL — ABNORMAL LOW (ref 8.9–10.3)
Chloride: 104 mmol/L (ref 98–111)
Creatinine, Ser: 1.05 mg/dL (ref 0.61–1.24)
GFR, Estimated: >60 mL/min (ref >60)
Glucose, Bld: 119 mg/dL — ABNORMAL HIGH (ref 70–99)
Potassium: 3.7 mmol/L (ref 3.5–5.1)
Sodium: 141 mmol/L (ref 135–145)

## 2023-11-03 LAB — PHOSPHORUS: Phosphorus: 4.1 mg/dL (ref 2.5–4.6)

## 2023-11-03 MED ORDER — POTASSIUM CHLORIDE CRYS ER 20 MEQ PO TBCR
40.0000 meq | EXTENDED_RELEASE_TABLET | Freq: Once | ORAL | Status: AC
  Administered 2023-11-03: 40 meq via ORAL
  Filled 2023-11-03: qty 2

## 2023-11-03 MED ORDER — THIAMINE MONONITRATE 100 MG PO TABS
100.0000 mg | ORAL_TABLET | Freq: Every day | ORAL | Status: DC
  Administered 2023-11-04 – 2023-11-06 (×3): 100 mg via ORAL
  Filled 2023-11-03 (×4): qty 1

## 2023-11-03 MED ORDER — FUROSEMIDE 10 MG/ML IJ SOLN
40.0000 mg | Freq: Four times a day (QID) | INTRAMUSCULAR | Status: AC
  Administered 2023-11-03 (×2): 40 mg via INTRAVENOUS
  Filled 2023-11-03 (×2): qty 4

## 2023-11-03 MED ORDER — AMLODIPINE BESYLATE 10 MG PO TABS
10.0000 mg | ORAL_TABLET | Freq: Every day | ORAL | Status: DC
  Administered 2023-11-04 – 2023-11-07 (×4): 10 mg via ORAL
  Filled 2023-11-03 (×4): qty 1

## 2023-11-03 MED ORDER — ADULT MULTIVITAMIN W/MINERALS CH
1.0000 | ORAL_TABLET | Freq: Every day | ORAL | Status: DC
  Administered 2023-11-04 – 2023-11-06 (×3): 1 via ORAL
  Filled 2023-11-03 (×4): qty 1

## 2023-11-03 MED ORDER — GUAIFENESIN 100 MG/5ML PO LIQD
10.0000 mL | Freq: Four times a day (QID) | ORAL | Status: DC
  Administered 2023-11-03 – 2023-11-07 (×14): 10 mL via ORAL
  Filled 2023-11-03 (×15): qty 10

## 2023-11-03 MED ORDER — OSELTAMIVIR PHOSPHATE 75 MG PO CAPS
75.0000 mg | ORAL_CAPSULE | Freq: Two times a day (BID) | ORAL | Status: AC
  Administered 2023-11-03 – 2023-11-04 (×3): 75 mg via ORAL
  Filled 2023-11-03 (×3): qty 1

## 2023-11-03 MED ORDER — BETHANECHOL CHLORIDE 10 MG PO TABS
10.0000 mg | ORAL_TABLET | Freq: Three times a day (TID) | ORAL | Status: AC
  Administered 2023-11-03 – 2023-11-04 (×4): 10 mg via ORAL
  Filled 2023-11-03 (×4): qty 1

## 2023-11-03 MED ORDER — DOCUSATE SODIUM 100 MG PO CAPS
100.0000 mg | ORAL_CAPSULE | Freq: Two times a day (BID) | ORAL | Status: DC
Start: 2023-11-03 — End: 2023-11-07
  Administered 2023-11-06: 100 mg via ORAL
  Filled 2023-11-03 (×7): qty 1

## 2023-11-03 MED ORDER — AMLODIPINE BESYLATE 10 MG PO TABS
10.0000 mg | ORAL_TABLET | Freq: Every day | ORAL | Status: DC
  Administered 2023-11-03: 10 mg
  Filled 2023-11-03: qty 1

## 2023-11-03 MED ORDER — SENNA 8.6 MG PO TABS
2.0000 | ORAL_TABLET | Freq: Every day | ORAL | Status: DC
  Filled 2023-11-03 (×3): qty 2

## 2023-11-03 MED ORDER — BISACODYL 10 MG RE SUPP
10.0000 mg | Freq: Once | RECTAL | Status: AC
  Administered 2023-11-03: 10 mg via RECTAL
  Filled 2023-11-03: qty 1

## 2023-11-03 MED ORDER — POLYETHYLENE GLYCOL 3350 17 G PO PACK
17.0000 g | PACK | Freq: Every day | ORAL | Status: DC
  Administered 2023-11-06: 17 g via ORAL
  Filled 2023-11-03 (×4): qty 1

## 2023-11-03 MED ORDER — HYDRALAZINE HCL 20 MG/ML IJ SOLN
10.0000 mg | INTRAMUSCULAR | Status: DC | PRN
  Administered 2023-11-03: 10 mg via INTRAVENOUS
  Filled 2023-11-03: qty 1

## 2023-11-03 MED ORDER — FOLIC ACID 1 MG PO TABS
1.0000 mg | ORAL_TABLET | Freq: Every day | ORAL | Status: DC
  Administered 2023-11-04 – 2023-11-06 (×3): 1 mg via ORAL
  Filled 2023-11-03 (×4): qty 1

## 2023-11-03 MED ORDER — ORAL CARE MOUTH RINSE: 15.0000 mL | OROMUCOSAL | Status: DC | PRN

## 2023-11-03 MED ORDER — IRBESARTAN 75 MG PO TABS
75.0000 mg | ORAL_TABLET | Freq: Every day | ORAL | Status: DC
  Administered 2023-11-03: 75 mg
  Filled 2023-11-03: qty 1

## 2023-11-03 MED ORDER — IRBESARTAN 75 MG PO TABS
75.0000 mg | ORAL_TABLET | Freq: Every day | ORAL | Status: DC
  Filled 2023-11-03: qty 1

## 2023-11-03 NOTE — Progress Notes (Signed)
AC discussed with patient's mother in reference to privacy and safety. All concerns addressed. Per [facility] - 2 family members can stay tonight but not after. Mother confirmed understanding.

## 2023-11-03 NOTE — Procedures (Signed)
Extubation Procedure Note  Patient Details:   Name: Camauri Honts DOB: 10-26-1975 MRN: [id number]   Airway Documentation:    Vent end date: 11/03/23 Vent end time: 0935   Evaluation  O2 sats: stable throughout Complications: No apparent complications Patient did tolerate procedure well. Bilateral Breath Sounds: Rhonchi, Diminished   Yes  Pt extubated per MD order, placed on HHFNC 25L 100%. Positive cuff leak, no stridor heard. No complications noted. RT will continue to monitor.   Vicente Masson 11/03/2023, 9:39 AM

## 2023-11-03 NOTE — Plan of Care (Signed)
  Problem: Clinical Measurements: Goal: Respiratory complications will improve Outcome: Progressing Goal: Cardiovascular complication will be avoided Outcome: Progressing   Problem: Activity: Goal: Risk for activity intolerance will decrease Outcome: Progressing   Problem: Coping: Goal: Level of anxiety will decrease Outcome: Progressing   Problem: Pain Management: Goal: General experience of comfort will improve Outcome: Progressing   Problem: Safety: Goal: Ability to remain free from injury will improve Outcome: Progressing

## 2023-11-03 NOTE — Progress Notes (Signed)
eLink Physician-Brief Progress Note Patient Name: Dale Clark DOB: July 21, 1976 MRN: [id number]   Date of Service  11/03/2023  HPI/Events of Note  pt has passed swallow eval and requesting diet.  Extubated to HFNC today  eICU Interventions  Clear liquids Dc TFs & TF insulin coveage Pharmacy can cahnge other meds to PO     Intervention Category Minor Interventions: Routine modifications to care plan (e.g. PRN medications for pain, fever)  Ritamarie Arkin V. Mileah Hemmer 11/03/2023, 8:01 PM
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
## 2023-11-03 NOTE — Progress Notes (Signed)
Patient's vitals are as follows. Currently patient's sedation is off for SAT/SBT. Fentanyl bolus administered for agitation and Labetalol 20 mg given for BP. BP currently being taken on R leg - will move to R arm once PIV's are removed.   11/03/23 0900  Vitals  BP (!) 225/100  MAP (mmHg) 133  Pulse Rate (!) 128  ECG Heart Rate (!) 128  Resp (!) 25  Oxygen Therapy  SpO2 93 %  MEWS Score  MEWS Temp 0  MEWS Systolic 2  MEWS Pulse 2  MEWS RR 1  MEWS LOC 2  MEWS Score 7  MEWS Score Color Red

## 2023-11-03 NOTE — Progress Notes (Signed)
BP remaining high. Hunsucker, MD informed and ordered PRN Hydralazine. Medication given. BP cuff switched to patient's R upper arm.     11/03/23 1100  Vitals  BP (!) 206/108  MAP (mmHg) 135  Pulse Rate (!) 107  ECG Heart Rate (!) 107  Resp (!) 25  Oxygen Therapy  SpO2 97 %  MEWS Score  MEWS Temp 0  MEWS Systolic 2  MEWS Pulse 1  MEWS RR 1  MEWS LOC 0  MEWS Score 4  MEWS Score Color Red

## 2023-11-03 NOTE — TOC Progression Note (Signed)
Transition of Care [facility]) - Progression Note    Patient Details  Name: Dale Clark MRN: [id number] Date of Birth: 03/13/1976  Transition of Care [facility]) CM/SW Contact  Marliss Coots, LCSW Phone Number: 11/03/2023, 12:34 PM  Clinical Narrative:     12:34 PM Per bedside RN, patient has been extubated but is not fully oriented. TOC will continue to follow.    Barriers to Discharge: Continued Medical Work up  Expected Discharge Plan and Services In-house Referral: Clinical Social Work     Living arrangements for the past 2 months: Single Family Home                                       Social Determinants of Health (SDOH) Interventions SDOH Screenings   Food Insecurity: No Food Insecurity (10/31/2023)  Housing: Low Risk  (10/31/2023)  Transportation Needs: No Transportation Needs (10/31/2023)  Utilities: Not At Risk (10/31/2023)  Financial Resource Strain: Low Risk  (08/23/2023)   Received from [facility]  Social Connections: Socially Isolated (10/31/2023)  Tobacco Use: High Risk (10/30/2023)    Readmission Risk Interventions     No data to display

## 2023-11-04 ENCOUNTER — Inpatient Hospital Stay (HOSPITAL_COMMUNITY): Payer: No Typology Code available for payment source

## 2023-11-04 LAB — CBC
HCT: 34.2 % — ABNORMAL LOW (ref 39.0–52.0)
Hemoglobin: 11.5 g/dL — ABNORMAL LOW (ref 13.0–17.0)
MCH: 32.6 pg (ref 26.0–34.0)
MCHC: 33.6 g/dL (ref 30.0–36.0)
MCV: 96.9 fL (ref 80.0–100.0)
Platelets: 259 10*3/uL (ref 150–400)
RBC: 3.53 MIL/uL — ABNORMAL LOW (ref 4.22–5.81)
RDW: 12.5 % (ref 11.5–15.5)
WBC: 17.1 10*3/uL — ABNORMAL HIGH (ref 4.0–10.5)
nRBC: 0 % (ref 0.0–0.2)

## 2023-11-04 LAB — BASIC METABOLIC PANEL
Anion gap: 12 (ref 5–15)
BUN: 14 mg/dL (ref 6–20)
CO2: 27 mmol/L (ref 22–32)
Calcium: 8.4 mg/dL — ABNORMAL LOW (ref 8.9–10.3)
Chloride: 100 mmol/L (ref 98–111)
Creatinine, Ser: 0.94 mg/dL (ref 0.61–1.24)
GFR, Estimated: >60 mL/min (ref >60)
Glucose, Bld: 130 mg/dL — ABNORMAL HIGH (ref 70–99)
Potassium: 3.5 mmol/L (ref 3.5–5.1)
Sodium: 139 mmol/L (ref 135–145)

## 2023-11-04 LAB — GLUCOSE, CAPILLARY
Glucose-Capillary: 118 mg/dL — ABNORMAL HIGH (ref 70–99)
Glucose-Capillary: 156 mg/dL — ABNORMAL HIGH (ref 70–99)
Glucose-Capillary: 172 mg/dL — ABNORMAL HIGH (ref 70–99)
Glucose-Capillary: 177 mg/dL — ABNORMAL HIGH (ref 70–99)
Glucose-Capillary: 207 mg/dL — ABNORMAL HIGH (ref 70–99)
Glucose-Capillary: 225 mg/dL — ABNORMAL HIGH (ref 70–99)

## 2023-11-04 LAB — CULTURE, BLOOD (ROUTINE X 2)
Culture: NO GROWTH
Culture: NO GROWTH
Special Requests: ADEQUATE
Special Requests: ADEQUATE

## 2023-11-04 MED ORDER — IRBESARTAN 75 MG PO TABS
75.0000 mg | ORAL_TABLET | Freq: Once | ORAL | Status: DC
  Filled 2023-11-04: qty 1

## 2023-11-04 MED ORDER — INSULIN ASPART 100 UNIT/ML IJ SOLN: 0.0000 [IU] | Freq: Every day | INTRAMUSCULAR | Status: DC

## 2023-11-04 MED ORDER — ONDANSETRON HCL 4 MG/2ML IJ SOLN
4.0000 mg | Freq: Four times a day (QID) | INTRAMUSCULAR | Status: DC | PRN
  Administered 2023-11-04 (×3): 4 mg via INTRAVENOUS
  Filled 2023-11-04 (×3): qty 2

## 2023-11-04 MED ORDER — POLYETHYLENE GLYCOL 3350 17 G PO PACK: 17.0000 g | PACK | Freq: Every day | ORAL | Status: DC | PRN

## 2023-11-04 MED ORDER — IRBESARTAN 300 MG PO TABS
150.0000 mg | ORAL_TABLET | Freq: Every day | ORAL | Status: DC
  Administered 2023-11-04 – 2023-11-07 (×4): 150 mg via ORAL
  Filled 2023-11-04 (×4): qty 1

## 2023-11-04 MED ORDER — INSULIN ASPART 100 UNIT/ML IJ SOLN
0.0000 [IU] | Freq: Three times a day (TID) | INTRAMUSCULAR | Status: DC
  Administered 2023-11-04: 3 [IU] via SUBCUTANEOUS
  Administered 2023-11-04: 5 [IU] via SUBCUTANEOUS
  Administered 2023-11-05: 2 [IU] via SUBCUTANEOUS
  Administered 2023-11-05: 5 [IU] via SUBCUTANEOUS
  Administered 2023-11-05: 3 [IU] via SUBCUTANEOUS
  Administered 2023-11-06: 5 [IU] via SUBCUTANEOUS
  Administered 2023-11-06 – 2023-11-07 (×2): 3 [IU] via SUBCUTANEOUS

## 2023-11-04 MED ORDER — POTASSIUM CHLORIDE CRYS ER 20 MEQ PO TBCR: 40.0000 meq | EXTENDED_RELEASE_TABLET | Freq: Once | ORAL | Status: DC

## 2023-11-04 MED ORDER — ACETAMINOPHEN 325 MG PO TABS: 650.0000 mg | ORAL_TABLET | ORAL | Status: DC | PRN

## 2023-11-04 MED ORDER — PHENOL 1.4 % MT LIQD
2.0000 | OROMUCOSAL | Status: DC | PRN
  Administered 2023-11-04: 2 via OROMUCOSAL
  Filled 2023-11-04: qty 177

## 2023-11-04 MED ORDER — IRBESARTAN 150 MG PO TABS: 150.0000 mg | ORAL_TABLET | Freq: Every day | ORAL | Status: DC

## 2023-11-04 MED ORDER — MELATONIN 5 MG PO TABS
5.0000 mg | ORAL_TABLET | Freq: Every evening | ORAL | Status: DC | PRN
  Filled 2023-11-04 (×3): qty 1

## 2023-11-04 MED ORDER — POTASSIUM CHLORIDE 20 MEQ PO PACK
40.0000 meq | PACK | Freq: Once | ORAL | Status: AC
  Administered 2023-11-04: 40 meq via ORAL
  Filled 2023-11-04: qty 2

## 2023-11-04 MED ORDER — BOOST / RESOURCE BREEZE PO LIQD CUSTOM
1.0000 | Freq: Three times a day (TID) | ORAL | Status: DC
  Administered 2023-11-05 – 2023-11-06 (×3): 1 via ORAL

## 2023-11-04 NOTE — Progress Notes (Signed)
Nutrition Follow-up  DOCUMENTATION CODES:  Not applicable  INTERVENTION:  Advance diet as tolerated Boost Breeze po TID, each supplement provides 250 kcal and 9 grams of protein Continue MVI, thiamine, and folic acid  NUTRITION DIAGNOSIS:  Increased nutrient needs related to acute illness as evidenced by estimated needs. - remains applicable  GOAL:  Patient will meet greater than or equal to 90% of their needs - progressing  MONITOR:  TF tolerance, Vent status, Labs, Weight trends  REASON FOR ASSESSMENT:  Consult Enteral/tube feeding initiation and management  ASSESSMENT:  Pt with hx of DM type 2, tobacco use, and HTN presented to ED with SOB and general malaise. Found to be positive for Flu A  1/12 - presented to ED at [facility], transferred to [facility] ICU 1/13 - respiratory decline, intubated  1/16 - extubated  Pt able to be extubated yesterday and oxygen support being decreased. Discussed in rounds, pt able to work with PT this AM and is currently sitting up in chair. RN reports pt still on clear liquid due to abdominal pain. XR pending. Will add nutrition supplements to augment intake.   Admit weight: 90.7 kg ? Accuracy, no recent weight hx  Current weight: 85 kg   Limited weight hx in chart over the last few years   Intake/Output Summary (Last 24 hours) at 11/04/2023 0845 Last data filed at 11/04/2023 0700 Gross per 24 hour  Intake 1067.12 ml  Output 2861 ml  Net -1793.88 ml  Net IO Since Admission: 4,747.34 mL [11/04/23 0845]  Drains/Lines: PICC triple lumen UOP x 24 hours  Nutritionally Relevant Medications: Scheduled Meds:  docusate sodium  100 mg Oral BID   folic acid  1 mg Oral Daily   insulin aspart  0-15 Units Subcutaneous Q4H   multivitamin with minerals  1 tablet Oral Daily   polyethylene glycol  17 g Oral Daily   senna  2 tablet Oral QHS   thiamine  100 mg Oral Daily   PRN Meds: ondansetron, phenol, polyethylene  glycol  Labs Reviewed: CBG ranges from 104-234 mg/dL over the last 24 hours HgbA1c 8.4% (1/12)  NUTRITION - FOCUSED PHYSICAL EXAM: Flowsheet Row Most Recent Value  Orbital Region No depletion  Upper Arm Region No depletion  Thoracic and Lumbar Region No depletion  Buccal Region No depletion  Temple Region No depletion  Clavicle Bone Region No depletion  Clavicle and Acromion Bone Region No depletion  Scapular Bone Region No depletion  Dorsal Hand No depletion  Patellar Region No depletion  Anterior Thigh Region No depletion  Posterior Calf Region No depletion  Edema (RD Assessment) None  Hair Reviewed  Eyes Reviewed  Mouth Reviewed  Skin Reviewed  Nails Reviewed   Diet Order:   Diet Order             Diet clear liquid Room service appropriate? Yes; Fluid consistency: Thin  Diet effective now                   EDUCATION NEEDS:  Not appropriate for education at this time  Skin:  Skin Assessment: Reviewed RN Assessment  Last BM:  1/17 - type 7  Height:  Ht Readings from Last 1 Encounters:  10/30/23 5\' 10"  (1.778 m)    Weight:  Wt Readings from Last 1 Encounters:  11/04/23 85 kg    Ideal Body Weight:  75.5 kg  BMI:  Body mass index is 26.89 kg/m.  Estimated Nutritional Needs:  Kcal:  2300-2500 kcal/d Protein:  115-130g/d Fluid:  2.3-2.5L/d   Greig Castilla, RD, LDN Registered Dietitian II Please reach out via secure chat Weekend on-call pager # available in [facility]

## 2023-11-04 NOTE — Evaluation (Signed)
Physical Therapy Evaluation Patient Details Name: Dale Clark MRN: [id number] DOB: 1976-01-31 Today's Date: 11/04/2023  History of Present Illness  48 y.o. male presents to [facility] hospital on 10/30/2023 with SOB and fever. Pt found to have fluA with PNA. Pt required intubation on 1/12, extubated 1/16. PMH includes PE, polyclonal gammopathy, thrombocytosis, hepatic steatosis, tobacco use, ETOH use.  Clinical Impression  Pt presents to PT with deficits in functional mobility, gait, balance, strength, power, endurance. Pt is able to stand and pivot to recliner with limited physical assistance along with assist for line management. PT defers ambulation attempts as pt remains on HHFNC and reports significant abdominal pain. PT will continue to follow in an effort to improve activity tolerance and to restore independence.        If plan is discharge home, recommend the following: A little help with walking and/or transfers;A lot of help with bathing/dressing/bathroom;Assistance with cooking/housework;Assist for transportation;Help with stairs or ramp for entrance   Can travel by private vehicle        Equipment Recommendations  (TBD pending progress)  Recommendations for Other Services       Functional Status Assessment Patient has had a recent decline in their functional status and demonstrates the ability to make significant improvements in function in a reasonable and predictable amount of time.     Precautions / Restrictions Precautions Precautions: Fall Precaution Comments: HHFNC Restrictions Weight Bearing Restrictions Per Provider Order: No      Mobility  Bed Mobility Overal bed mobility: Needs Assistance Bed Mobility: Supine to Sit     Supine to sit: Contact guard, HOB elevated          Transfers Overall transfer level: Needs assistance Equipment used: None Transfers: Sit to/from Stand, Bed to chair/wheelchair/BSC Sit to Stand: Contact guard assist   Step pivot  transfers: Contact guard assist            Ambulation/Gait Ambulation/Gait assistance:  (deferred due to [facility] and abdominal pain)                Stairs            Wheelchair Mobility     Tilt Bed    Modified Rankin (Stroke Patients Only)       Balance Overall balance assessment: Needs assistance Sitting-balance support: No upper extremity supported, Feet supported Sitting balance-Leahy Scale: Good     Standing balance support: Single extremity supported, Reliant on assistive device for balance Standing balance-Leahy Scale: Poor                               Pertinent Vitals/Pain Pain Assessment Pain Assessment: 0-10 Pain Score: 8  Pain Location: abdomen Pain Descriptors / Indicators: Sharp Pain Intervention(s): Monitored during session    Home Living Family/patient expects to be discharged to:: Private residence Living Arrangements: Spouse/significant other Available Help at Discharge: Family;Available 24 hours/day Type of Home: House Home Access: Stairs to enter Entrance Stairs-Rails: None Entrance Stairs-Number of Steps: 1 Alternate Level Stairs-Number of Steps: 12 Home Layout: Two level Home Equipment: None      Prior Function Prior Level of Function : Independent/Modified Independent;Driving;Working/employed             Mobility Comments: ambulatory without DME, works in Secondary school teacher Extremity Assessment Upper Extremity Assessment: Generalized weakness    Lower Extremity Assessment Lower Extremity Assessment:  Generalized weakness    Cervical / Trunk Assessment Cervical / Trunk Assessment: Normal  Communication   Communication Communication: No apparent difficulties (soft spoken) Cueing Techniques: Verbal cues  Cognition Arousal: Alert Behavior During Therapy: Flat affect Overall Cognitive Status: Within Functional Limits for tasks  assessed                                          General Comments General comments (skin integrity, edema, etc.): VSS on 25L 75% HHFNC. Pt with wounds on posterior neck which are banda\ged    Exercises     Assessment/Plan    PT Assessment Patient needs continued PT services  PT Problem List Decreased strength;Decreased activity tolerance;Decreased balance;Decreased mobility;Decreased knowledge of use of DME;Cardiopulmonary status limiting activity       PT Treatment Interventions DME instruction;Gait training;Functional mobility training;Therapeutic activities;Stair training;Therapeutic exercise;Balance training;Neuromuscular re-education;Cognitive remediation;Patient/family education    PT Goals (Current goals can be found in the Care Plan section)  Acute Rehab PT Goals Patient Stated Goal: to return to independence PT Goal Formulation: With patient/family Time For Goal Achievement: 11/18/23 Potential to Achieve Goals: Good    Frequency Min 1X/week     Co-evaluation               AM-PAC PT "6 Clicks" Mobility  Outcome Measure Help needed turning from your back to your side while in a flat bed without using bedrails?: A Little Help needed moving from lying on your back to sitting on the side of a flat bed without using bedrails?: A Little Help needed moving to and from a bed to a chair (including a wheelchair)?: A Little Help needed standing up from a chair using your arms (e.g., wheelchair or bedside chair)?: A Little Help needed to walk in hospital room?: A Lot Help needed climbing 3-5 steps with a railing? : Total 6 Click Score: 15    End of Session Equipment Utilized During Treatment: Oxygen Activity Tolerance: Patient tolerated treatment well Patient left: in chair;with call bell/phone within reach;with family/visitor present Nurse Communication: Mobility status PT Visit Diagnosis: Other abnormalities of gait and mobility (R26.89);Muscle  weakness (generalized) (M62.81)    Time: 1040-1108 PT Time Calculation (min) (ACUTE ONLY): 28 min   Charges:   PT Evaluation $PT Eval Moderate Complexity: 1 Mod   PT General Charges $$ ACUTE PT VISIT: 1 Visit         Arlyss Gandy, PT, DPT Acute Rehabilitation Office [phone]   Arlyss Gandy 11/04/2023, 11:16 AM

## 2023-11-04 NOTE — TOC Progression Note (Signed)
Transition of Care [facility]) - Progression Note    Patient Details  Name: Dale Clark MRN: [id number] Date of Birth: [birth year]  Transition of Care [facility]) CM/SW Contact  Marliss Coots, LCSW Phone Number: 11/04/2023, 1:53 PM  Clinical Narrative:     1:54 PM Per chart review, TOC consult for substance use/counseling was cancelled by bedside RN 01/13. Patient has been extubated and is currently fully oriented. TOC will continue to be available.     Barriers to Discharge: Continued Medical Work up  Expected Discharge Plan and Services In-house Referral: Clinical Social Work     Living arrangements for the past 2 months: Single Family Home                                       Social Determinants of Health (SDOH) Interventions SDOH Screenings   Food Insecurity: No Food Insecurity (10/31/2023)  Housing: Low Risk  (10/31/2023)  Transportation Needs: No Transportation Needs (10/31/2023)  Utilities: Not At Risk (10/31/2023)  Financial Resource Strain: Low Risk  (08/23/2023)   Received from [facility]  Social Connections: Socially Isolated (10/31/2023)  Tobacco Use: High Risk (10/30/2023)    Readmission Risk Interventions     No data to display

## 2023-11-04 NOTE — Progress Notes (Signed)
[facility] ADULT ICU REPLACEMENT PROTOCOL   The patient does apply for the [facility] Adult ICU Electrolyte Replacment Protocol based on the criteria listed below:   1.Exclusion criteria: TCTS, ECMO, Dialysis, and Myasthenia Gravis patients 2. Is GFR >/= 30 ml/min? Yes.    Patient's GFR today is >60 3. Is SCr </= 2? Yes.   Patient's SCr is 0.94 mg/dL 4. Did SCr increase >/= 0.5 in 24 hours? No. 5.Pt's weight >40kg  Yes.   6. Abnormal electrolyte(s): potassium 3.5  7. Electrolytes replaced per protocol 8.  Call MD STAT for K+ </= 2.5, Phos </= 1, or Mag </= 1 Physician:  protocol  Melvern Banker 11/04/2023 4:30 AM

## 2023-11-04 NOTE — Plan of Care (Signed)
  Problem: Health Behavior/Discharge Planning: Goal: Ability to manage health-related needs will improve Outcome: Progressing   Problem: Clinical Measurements: Goal: Will remain free from infection Outcome: Progressing Goal: Respiratory complications will improve Outcome: Progressing   Problem: Safety: Goal: Ability to remain free from injury will improve Outcome: Progressing   Problem: Role Relationship: Goal: Method of communication will improve Outcome: Progressing
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
## 2023-11-05 ENCOUNTER — Inpatient Hospital Stay (HOSPITAL_COMMUNITY): Payer: No Typology Code available for payment source

## 2023-11-05 DIAGNOSIS — J189 Pneumonia, unspecified organism: Secondary | ICD-10-CM

## 2023-11-05 DIAGNOSIS — J9601 Acute respiratory failure with hypoxia: Secondary | ICD-10-CM | POA: Diagnosis not present

## 2023-11-05 LAB — GLUCOSE, CAPILLARY
Glucose-Capillary: 165 mg/dL — ABNORMAL HIGH (ref 70–99)
Glucose-Capillary: 231 mg/dL — ABNORMAL HIGH (ref 70–99)
Glucose-Capillary: 199 mg/dL — ABNORMAL HIGH (ref 70–99)
Glucose-Capillary: 130 mg/dL — ABNORMAL HIGH (ref 70–99)
Glucose-Capillary: 104 mg/dL — ABNORMAL HIGH (ref 70–99)

## 2023-11-05 LAB — CBC
HCT: 34.5 % — ABNORMAL LOW (ref 39.0–52.0)
Hemoglobin: 11.6 g/dL — ABNORMAL LOW (ref 13.0–17.0)
MCH: 32.4 pg (ref 26.0–34.0)
MCHC: 33.6 g/dL (ref 30.0–36.0)
MCV: 96.4 fL (ref 80.0–100.0)
Platelets: 329 10*3/uL (ref 150–400)
RBC: 3.58 MIL/uL — ABNORMAL LOW (ref 4.22–5.81)
RDW: 12.3 % (ref 11.5–15.5)
WBC: 14.4 10*3/uL — ABNORMAL HIGH (ref 4.0–10.5)
nRBC: 0 % (ref 0.0–0.2)

## 2023-11-05 LAB — BASIC METABOLIC PANEL
Anion gap: 10 (ref 5–15)
BUN: 15 mg/dL (ref 6–20)
CO2: 26 mmol/L (ref 22–32)
Calcium: 8.8 mg/dL — ABNORMAL LOW (ref 8.9–10.3)
Chloride: 100 mmol/L (ref 98–111)
Creatinine, Ser: 0.95 mg/dL (ref 0.61–1.24)
GFR, Estimated: >60 mL/min (ref >60)
Glucose, Bld: 169 mg/dL — ABNORMAL HIGH (ref 70–99)
Potassium: 3.8 mmol/L (ref 3.5–5.1)
Sodium: 136 mmol/L (ref 135–145)

## 2023-11-05 MED ORDER — IPRATROPIUM-ALBUTEROL 0.5-2.5 (3) MG/3ML IN SOLN: 3.0000 mL | Freq: Four times a day (QID) | RESPIRATORY_TRACT | Status: DC | PRN

## 2023-11-05 NOTE — Progress Notes (Signed)
RT NOTE: patient removed from heated high flow nasal cannula and placed on 8L salter high flow nasal cannula.  Tolerating well at this time.  Will continue to monitor.
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
## 2023-11-05 NOTE — Evaluation (Addendum)
Occupational Therapy Evaluation Patient Details Name: Dale Clark MRN: [id number] DOB: [birth year] Today's Date: 11/05/2023   History of Present Illness 48 y.o. male presents to [facility] hospital on 10/30/2023 with SOB and fever. Pt found to have fluA with PNA. Pt required intubation on 1/12, extubated 1/16. PMH includes PE, polyclonal gammopathy, thrombocytosis, hepatic steatosis, tobacco use, ETOH use.   Clinical Impression   PTA, pt lives with spouse and typically completely independent at baseline including full time work. Pt presents now with deficits in cardiopulmonary endurance and dynamic standing balance. Pt recently weaned from [facility] to [facility]NC; able to mobilize around ICU unit with CGA using RW for support during session. Pt requires no more than CGA for ADLs; provided energy conservation handout and initial education. Anticipate no OT needs at DC w/ continued mobility and O2 weaning.   SpO2 > 90% on 6 L HFNC during activity HR 100s      If plan is discharge home, recommend the following: Assistance with cooking/housework    Functional Status Assessment  Patient has had a recent decline in their functional status and demonstrates the ability to make significant improvements in function in a reasonable and predictable amount of time.  Equipment Recommendations  Other (comment) (RW)    Recommendations for Other Services       Precautions / Restrictions Precautions Precautions: Fall Precaution Comments: monitor O2 Restrictions Weight Bearing Restrictions Per Provider Order: No      Mobility Bed Mobility Overal bed mobility: Needs Assistance Bed Mobility: Supine to Sit     Supine to sit: Supervision, HOB elevated     General bed mobility comments: increased time/effort    Transfers Overall transfer level: Needs assistance Equipment used: Rolling walker (2 wheels) Transfers: Sit to/from Stand Sit to Stand: Supervision                  Balance Overall  balance assessment: Needs assistance Sitting-balance support: No upper extremity supported, Feet supported Sitting balance-Leahy Scale: Good     Standing balance support: Bilateral upper extremity supported, No upper extremity supported, During functional activity Standing balance-Leahy Scale: Fair                             ADL either performed or assessed with clinical judgement   ADL Overall ADL's : Needs assistance/impaired Eating/Feeding: Independent   Grooming: Supervision/safety;Standing   Upper Body Bathing: Set up   Lower Body Bathing: Contact guard assist;Sit to/from stand;Sitting/lateral leans   Upper Body Dressing : Set up;Sitting   Lower Body Dressing: Contact guard assist;Sitting/lateral leans;Sit to/from stand   Toilet Transfer: Contact guard assist;Ambulation;Rolling walker (2 wheels)   Toileting- Clothing Manipulation and Hygiene: Contact guard assist;Sitting/lateral lean;Sit to/from stand       Functional mobility during ADLs: Contact guard assist;Rolling walker (2 wheels) General ADL Comments: off of HHFNC this AM and able to mobilize around entire ICU unit w/ wife providing chair follow. provided energy conservation handout     Vision Ability to See in Adequate Light: 0 Adequate Patient Visual Report: No change from baseline Vision Assessment?: No apparent visual deficits     Perception         Praxis         Pertinent Vitals/Pain Pain Assessment Pain Assessment: No/denies pain     Extremity/Trunk Assessment Upper Extremity Assessment Upper Extremity Assessment: Generalized weakness;Right hand dominant   Lower Extremity Assessment Lower Extremity Assessment: Defer to PT evaluation  Cervical / Trunk Assessment Cervical / Trunk Assessment: Normal   Communication Communication Communication: No apparent difficulties   Cognition Arousal: Alert Behavior During Therapy: Flat affect Overall Cognitive Status: Within  Functional Limits for tasks assessed                                 General Comments: denies feeling foggy, minor slower processing but could be due to fatigue. per pt and family, pt normally quiet at baseline     General Comments  Mother and wife at bedside    Exercises     Shoulder Instructions      Home Living Family/patient expects to be discharged to:: Private residence Living Arrangements: Spouse/significant other Available Help at Discharge: Family;Available 24 hours/day Type of Home: House Home Access: Stairs to enter [employer] of Steps: 1 Entrance Stairs-Rails: None Home Layout: Two level Alternate Level Stairs-Number of Steps: 12 Alternate Level Stairs-Rails: Left Bathroom Shower/Tub: Walk-in shower;Tub/shower unit   Bathroom Toilet: Standard     Home Equipment: None   Additional Comments: wife works from home      Prior Functioning/Environment Prior Level of Function : Independent/Modified Independent;Driving;Working/employed             Mobility Comments: ambulatory without DME, works in [employer] Problem List: Decreased activity tolerance;Impaired balance (sitting and/or standing);Cardiopulmonary status limiting activity;Decreased knowledge of use of DME or AE      OT Treatment/Interventions: Self-care/ADL training;Therapeutic exercise;Energy conservation;DME and/or AE instruction;Therapeutic activities;Patient/family education    OT Goals(Current goals can be found in the care plan section) Acute Rehab OT Goals Patient Stated Goal: wean off of O2, be able to go back to work OT Goal Formulation: With patient/family Time For Goal Achievement: 11/19/23 Potential to Achieve Goals: Good ADL Goals Additional ADL Goal #1: Pt to verbalize at least 3 energy conservation strategies to implement at home Additional ADL Goal #2: Pt to increase standing activity tolerance > 15 min  during ADLs/mobility without seated rest break  OT Frequency: Min 1X/week    Co-evaluation              AM-PAC OT "6 Clicks" Daily Activity     Outcome Measure Help from another person eating meals?: None Help from another person taking care of personal grooming?: A Little Help from another person toileting, which includes using toliet, bedpan, or urinal?: A Little Help from another person bathing (including washing, rinsing, drying)?: A Little Help from another person to put on and taking off regular upper body clothing?: A Little Help from another person to put on and taking off regular lower body clothing?: A Little 6 Click Score: 19   End of Session Equipment Utilized During Treatment: Gait belt;Rolling walker (2 wheels);Oxygen Nurse Communication: Mobility status  Activity Tolerance: Patient tolerated treatment well Patient left: in chair;with call bell/phone within reach;with family/visitor present  OT Visit Diagnosis: Unsteadiness on feet (R26.81);Other (comment) (decreased cardiopulmonary status)                Time: 6213-0865 OT Time Calculation (min): 34 min Charges:  OT General Charges $OT Visit: 1 Visit OT Evaluation $OT Eval Moderate Complexity: 1 Mod OT Treatments $Therapeutic Activity: 8-22 mins  Bradd Canary, OTR/L Acute Rehab Services Office: [phone]   Lorre Munroe 11/05/2023, 12:23 PM

## 2023-11-06 ENCOUNTER — Inpatient Hospital Stay (HOSPITAL_COMMUNITY): Payer: No Typology Code available for payment source

## 2023-11-06 DIAGNOSIS — J80 Acute respiratory distress syndrome: Secondary | ICD-10-CM | POA: Diagnosis not present

## 2023-11-06 LAB — BASIC METABOLIC PANEL
Anion gap: 10 (ref 5–15)
BUN: 13 mg/dL (ref 6–20)
CO2: 25 mmol/L (ref 22–32)
Calcium: 8.7 mg/dL — ABNORMAL LOW (ref 8.9–10.3)
Chloride: 101 mmol/L (ref 98–111)
Creatinine, Ser: 1 mg/dL (ref 0.61–1.24)
GFR, Estimated: >60 mL/min (ref >60)
Glucose, Bld: 118 mg/dL — ABNORMAL HIGH (ref 70–99)
Potassium: 3.7 mmol/L (ref 3.5–5.1)
Sodium: 136 mmol/L (ref 135–145)

## 2023-11-06 LAB — CBC WITH DIFFERENTIAL/PLATELET
Abs Immature Granulocytes: 0.1 10*3/uL — ABNORMAL HIGH (ref 0.00–0.07)
Basophils Absolute: 0 10*3/uL (ref 0.0–0.1)
Basophils Relative: 0 %
Eosinophils Absolute: 0.3 10*3/uL (ref 0.0–0.5)
Eosinophils Relative: 3 %
HCT: 35.6 % — ABNORMAL LOW (ref 39.0–52.0)
Hemoglobin: 11.7 g/dL — ABNORMAL LOW (ref 13.0–17.0)
Immature Granulocytes: 1 %
Lymphocytes Relative: 17 %
Lymphs Abs: 1.8 10*3/uL (ref 0.7–4.0)
MCH: 32.1 pg (ref 26.0–34.0)
MCHC: 32.9 g/dL (ref 30.0–36.0)
MCV: 97.8 fL (ref 80.0–100.0)
Monocytes Absolute: 1.1 10*3/uL — ABNORMAL HIGH (ref 0.1–1.0)
Monocytes Relative: 11 %
Neutro Abs: 6.9 10*3/uL (ref 1.7–7.7)
Neutrophils Relative %: 68 %
Platelets: 349 10*3/uL (ref 150–400)
RBC: 3.64 MIL/uL — ABNORMAL LOW (ref 4.22–5.81)
RDW: 12 % (ref 11.5–15.5)
WBC: 10.2 10*3/uL (ref 4.0–10.5)
nRBC: 0 % (ref 0.0–0.2)

## 2023-11-06 LAB — PHOSPHORUS: Phosphorus: 3.2 mg/dL (ref 2.5–4.6)

## 2023-11-06 LAB — C-REACTIVE PROTEIN: CRP: 10.1 mg/dL — ABNORMAL HIGH (ref <1.0)

## 2023-11-06 LAB — GLUCOSE, CAPILLARY
Glucose-Capillary: 202 mg/dL — ABNORMAL HIGH (ref 70–99)
Glucose-Capillary: 119 mg/dL — ABNORMAL HIGH (ref 70–99)
Glucose-Capillary: 175 mg/dL — ABNORMAL HIGH (ref 70–99)
Glucose-Capillary: 103 mg/dL — ABNORMAL HIGH (ref 70–99)

## 2023-11-06 LAB — BRAIN NATRIURETIC PEPTIDE: B Natriuretic Peptide: 55.9 pg/mL (ref 0.0–100.0)

## 2023-11-06 LAB — PROCALCITONIN: Procalcitonin: 0.87 ng/mL

## 2023-11-06 LAB — MAGNESIUM: Magnesium: 2 mg/dL (ref 1.7–2.4)

## 2023-11-06 MED ORDER — CARVEDILOL 6.25 MG PO TABS
6.2500 mg | ORAL_TABLET | Freq: Two times a day (BID) | ORAL | Status: DC
  Administered 2023-11-06 – 2023-11-07 (×3): 6.25 mg via ORAL
  Filled 2023-11-06 (×3): qty 1

## 2023-11-06 NOTE — Plan of Care (Signed)
  Problem: Clinical Measurements: Goal: Ability to maintain clinical measurements within normal limits will improve Outcome: Progressing Goal: Will remain free from infection Outcome: Progressing Goal: Respiratory complications will improve Outcome: Progressing   Problem: Activity: Goal: Risk for activity intolerance will decrease Outcome: Progressing   Problem: Nutrition: Goal: Adequate nutrition will be maintained Outcome: Progressing   Problem: Elimination: Goal: Will not experience complications related to bowel motility Outcome: Progressing   Problem: Activity: Goal: Ability to tolerate increased activity will improve Outcome: Completed/Met   Problem: Respiratory: Goal: Ability to maintain a clear airway and adequate ventilation will improve Outcome: Completed/Met   Problem: Role Relationship: Goal: Method of communication will improve Outcome: Completed/Met

## 2023-11-06 NOTE — Progress Notes (Signed)
PROGRESS NOTE                                                                                                                                                                                                             Patient Demographics:    Dale Clark, is a 48 y.o. male, DOB - 1975-11-23, WUJ:[id number]  Outpatient Primary MD for the patient is Norm Salt, [place]    LOS - 7  Admit date - 10/30/2023    Chief Complaint  Patient presents with   Shortness of Breath       Brief Narrative (HPI from H&P)   48 yo M PMH PE, polyclonal gammopathy, thrombocytosis, hepatic steatosis, tobacco use, etoh use, previous PE 2015 no longer on [facility] who presented to [facility] 1/12 w SOB, general sick sx beginning 2 d ago. Checked O2 at home and was in the 80s, Temp was 102. Found to have flu A PNA. His O2 needs escalated and required BiPAP support. CXR w/ b/l opacities R >L. CTA chest: no PE; possible pulmonary edema vs. Multifocal pneumonia .  He was admitted to ICU intubated, he was treated with Tamiflu and antibiotics.  Finished all treatment was transition to heated high flow and extubated on 11/03/2023 and finally transferred to my care on 11/06/2023 on day 7 of hospital stay.  Currently he has finished all antibiotic treatments along with Tamiflu treatment.   Significant Hospital Events:    1/12 Flu A PNA with progressive O2 needs on bipap, intubated overnight  1/14 art line discontinued, as it was dysfunctional; titrated off levophed 1/16 extubated to HFNC    Subjective:    Dale Clark today has, No headache, No chest pain, No abdominal pain - No Nausea, No new weakness tingling or numbness, mild cough and improved shortness of breath   Assessment  & Plan :    Acute hypoxic respiratory failure with severe ARDS 2/2 flu pneumonia with some suspicion of superimposed bacterial pneumonia as well.  Was admitted to ICU by PCCM remained  intubated for few days, finally extubated 11/03/2023 and transition to heated high flow, has finished his Tamiflu and antibiotic treatment.  Clinically much improved currently on 4 L nasal cannula oxygen, advance activity titrate down oxygen.  Encouraged to sit in chair use I-S and flutter valve for pulmonary toiletry.  If continues to improve likely home discharge in the next 1 to 2 days.  He had sepsis upon admission which has resolved.   AKI - due to sepsis, resolved resolved   HX of smoking and alcohol use disorder - Drinks vodka daily, last drink 1/10,  Continue multivitamin, folic acid, and thiamine.  No DTs.  Counseled to quit.   HTN  -in poor control on combination of Norvasc, ARB, added Coreg for better control.   Hx of polyclonal gammopathy & Hx of thrombocytosis - platelets remain wnl , sees heme at [facility]   T2DM - A1c 8.4%, - CBGs + SSI.  Diabetic and insulin teaching.  CBG (last 3)  Recent Labs    11/05/23 1200 11/05/23 1613 11/05/23 2210  GLUCAP 199* 130* 104*        Condition - Fair  Family Communication  : Wife bedside on 11/06/2023  Code Status : Full code  Consults  : Pulmonary critical care  PUD Prophylaxis :    Procedures  :            Disposition Plan  :    Status is: Inpatient  DVT Prophylaxis  :    enoxaparin (LOVENOX) injection 40 mg Start: 10/31/23 1000    Lab Results  Component Value Date   PLT 349 11/06/2023    Diet :  Diet Order             Diet Heart Room service appropriate? Yes; Fluid consistency: Thin  Diet effective now                    Inpatient Medications  Scheduled Meds:  amLODipine  10 mg Oral Daily   carvedilol  6.25 mg Oral BID WC   Chlorhexidine Gluconate Cloth  6 each Topical Daily   docusate sodium  100 mg Oral BID   enoxaparin (LOVENOX) injection  40 mg Subcutaneous Daily   feeding supplement  1 Container Oral TID BM   folic acid  1 mg Oral Daily   guaiFENesin  10 mL Oral Q6H   insulin aspart   0-15 Units Subcutaneous TID WC   insulin aspart  0-5 Units Subcutaneous QHS   irbesartan  150 mg Oral Daily   multivitamin with minerals  1 tablet Oral Daily   polyethylene glycol  17 g Oral Daily   senna  2 tablet Oral QHS   thiamine  100 mg Oral Daily   Continuous Infusions: PRN Meds:.acetaminophen, benzonatate, ipratropium-albuterol, labetalol, melatonin, ondansetron (ZOFRAN) IV, mouth rinse, phenol, polyethylene glycol, sodium chloride flush    Objective:   Vitals:   11/05/23 2000 11/05/23 2039 11/06/23 0006 11/06/23 0400  BP: (!) 150/95  (!) 151/91 (!) 148/97  Pulse: 92 90 92 97  Resp: 19 16 20 16   Temp:    98.2 F (36.8 C)  TempSrc:    Oral  SpO2: 96% 99% 97% 100%  Weight:      Height:        Wt Readings from Last 3 Encounters:  11/05/23 85 kg  04/08/19 90.7 kg  11/30/18 92.5 kg     Intake/Output Summary (Last 24 hours) at 11/06/2023 0807 Last data filed at 11/05/2023 2100 Gross per 24 hour  Intake --  Output 625 ml  Net -625 ml     Physical Exam  Awake Alert, No new F.N deficits, left arm PICC line [place].AT,PERRAL Supple Neck, No JVD,   Symmetrical Chest wall movement, Good air movement bilaterally, CTAB RRR,No Gallops,Rubs  or new Murmurs,  +ve B.Sounds, Abd Soft, No tenderness,   No Cyanosis, Clubbing or edema     Data Review:    Recent Labs  Lab 11/02/23 0418 11/03/23 0433 11/04/23 0335 11/05/23 0312 11/06/23 0610  WBC 11.7* 13.2* 17.1* 14.4* 10.2  HGB 11.0* 10.8* 11.5* 11.6* 11.7*  HCT 33.8* 33.1* 34.2* 34.5* 35.6*  PLT 182 191 259 329 349  MCV 100.6* 99.7 96.9 96.4 97.8  MCH 32.7 32.5 32.6 32.4 32.1  MCHC 32.5 32.6 33.6 33.6 32.9  RDW 13.0 13.1 12.5 12.3 12.0  LYMPHSABS  --   --   --   --  1.8  MONOABS  --   --   --   --  1.1*  EOSABS  --   --   --   --  0.3  BASOSABS  --   --   --   --  0.0    Recent Labs  Lab  0000 10/30/23 1442 10/30/23 1447 10/30/23 1448 10/30/23 1625 10/30/23 1931 10/31/23 0039 10/31/23 0154  10/31/23 0513 10/31/23 0815 10/31/23 1026 10/31/23 1715 10/31/23 2338 11/01/23 0541 11/01/23 1547 11/01/23 1808 11/02/23 0418 11/03/23 0433 11/04/23 0335 11/05/23 0312 11/06/23 0610  NA  --   --  127*  --   --    < > 132*   < >  --   --    < >  --    < > 137   < >  --  139 141 139 136 136  K  --   --  3.8  --   --    < > 4.0   < >  --   --    < >  --    < > 4.3   < >  --  4.1 3.7 3.5 3.8 3.7  CL   < >  --  95*  --   --   --  101   < >  --   --   --   --   --  106  --   --  105 104 100 100 101  CO2   < >  --  20*  --   --   --  21*   < >  --   --   --   --   --  24  --   --  26 27 27 26 25   ANIONGAP   < >  --  12  --   --   --  10   < >  --   --   --   --   --  7  --   --  8 10 12 10 10   GLUCOSE   < >  --  275*  --   --   --  269*   < >  --   --   --   --   --  171*  --   --  200* 119* 130* 169* 118*  BUN   < >  --  11  --   --   --  9   < >  --   --   --   --   --  20  --   --  19 18 14 15 13   CREATININE   < >  --  1.19  --   --   --  1.17   < >  --   --   --   --   --  1.33*  --   --  1.06 1.05 0.94 0.95 1.00  DDIMER  --   --  4.51*  --   --   --   --   --   --   --   --   --   --   --   --   --   --   --   --   --   --   PROCALCITON  --   --   --   --   --   --  3.09  --   --   --   --   --   --   --   --   --   --   --   --   --   --   LATICACIDVEN  --   --   --  2.8* 2.1*  --   --   --  3.5* 2.1*  --   --   --   --   --   --   --   --   --   --   --   HGBA1C  --  8.4*  --   --   --   --   --   --   --   --   --   --   --   --   --   --   --   --   --   --   --   BNP  --   --   --   --   --   --  140.3*  --   --   --   --   --   --   --   --   --   --   --   --   --   --   MG  --   --   --   --   --   --  1.6*   < >  --   --   --  3.2*  --  2.8*  --  2.5* 2.3  --   --   --  2.0  PHOS  --   --   --   --   --   --   --    < >  --   --   --  4.8*  --  2.9  --  2.4* 2.4* 4.1  --   --  3.2  CALCIUM   < >  --  8.1*  --   --   --  7.6*   < >  --   --   --   --   --  7.7*  --   --  7.9* 8.0*  8.4* 8.8* 8.7*   < > = values in this interval not displayed.      Recent Labs  Lab 10/30/23 1442 10/30/23 1447 10/30/23 1447 10/30/23 1448 10/30/23 1625 10/31/23 0039 10/31/23 1610 10/31/23 9604 10/31/23 0815 10/31/23 1715 11/01/23 5409 11/01/23 1808 11/02/23 8119 11/03/23 1478 11/04/23 0335 11/05/23 0312 11/06/23 0610  DDIMER  --  4.51*  --   --   --   --   --   --   --   --   --   --   --   --   --   --   --   PROCALCITON  --   --   --   --   --  3.09  --   --   --   --   --   --   --   --   --   --   --   LATICACIDVEN  --   --   --  2.8* 2.1*  --   --  3.5* 2.1*  --   --   --   --   --   --   --   --   HGBA1C 8.4*  --   --   --   --   --   --   --   --   --   --   --   --   --   --   --   --   BNP  --   --   --   --   --  140.3*  --   --   --   --   --   --   --   --   --   --   --   MG  --   --   --   --   --  1.6*   < >  --   --  3.2* 2.8* 2.5* 2.3  --   --   --  2.0  CALCIUM  --  8.1*   < >  --   --  7.6*   < >  --   --   --  7.7*  --  7.9* 8.0* 8.4* 8.8* 8.7*   < > = values in this interval not displayed.    --------------------------------------------------------------------------------------------------------------- Lab Results  Component Value Date   TRIG 128 11/01/2023    Lab Results  Component Value Date   HGBA1C 8.4 (H) 10/30/2023     Micro Results Recent Results (from the past 240 hours)  Resp panel by RT-PCR (RSV, Flu A&B, Covid) Anterior Nasal Swab     Status: Abnormal   Collection Time: 10/30/23  2:56 PM   Specimen: Anterior Nasal Swab  Result Value Ref Range Status   SARS Coronavirus 2 by RT PCR NEGATIVE NEGATIVE Final    Comment: (NOTE) SARS-CoV-2 target nucleic acids are NOT DETECTED.  The SARS-CoV-2 RNA is generally detectable in upper respiratory specimens during the acute phase of infection. The lowest concentration of SARS-CoV-2 viral copies this assay can detect is 138 copies/mL. A negative result does not preclude  SARS-Cov-2 infection and should not be used as the sole basis for treatment or other patient management decisions. A negative result may occur with  improper specimen collection/handling, submission of specimen other than nasopharyngeal swab, presence of viral mutation(s) within the areas targeted by this assay, and inadequate number of viral copies(<138 copies/mL). A negative result must be combined with clinical observations, patient history, and epidemiological information. The expected result is Negative.  Fact Sheet for Patients:  BloggerCourse.com  Fact Sheet for Healthcare Providers:  SeriousBroker.it  This test is no t yet approved or cleared by the [place] FDA and  has been authorized for detection and/or diagnosis of SARS-CoV-2 by FDA under an Emergency Use Authorization (EUA). This EUA will remain  in effect (meaning this test can be used) for the duration of the COVID-19 declaration under Section 564(b)(1) of the Act, 21 U.S.C.section 360bbb-3(b)(1), unless the authorization is terminated  or revoked sooner.       Influenza A by PCR POSITIVE (A) NEGATIVE Final   Influenza B by PCR NEGATIVE NEGATIVE Final  Comment: (NOTE) The Xpert Xpress SARS-CoV-2/FLU/RSV plus assay is intended as an aid in the diagnosis of influenza from Nasopharyngeal swab specimens and should not be used as a sole basis for treatment. Nasal washings and aspirates are unacceptable for Xpert Xpress SARS-CoV-2/FLU/RSV testing.  Fact Sheet for Patients: BloggerCourse.com  Fact Sheet for Healthcare Providers: SeriousBroker.it  This test is not yet approved or cleared by the [place] FDA and has been authorized for detection and/or diagnosis of SARS-CoV-2 by FDA under an Emergency Use Authorization (EUA). This EUA will remain in effect (meaning this test can be used) for the duration of  the COVID-19 declaration under Section 564(b)(1) of the Act, 21 U.S.C. section 360bbb-3(b)(1), unless the authorization is terminated or revoked.     Resp Syncytial Virus by PCR NEGATIVE NEGATIVE Final    Comment: (NOTE) Fact Sheet for Patients: BloggerCourse.com  Fact Sheet for Healthcare Providers: SeriousBroker.it  This test is not yet approved or cleared by the [place] FDA and has been authorized for detection and/or diagnosis of SARS-CoV-2 by FDA under an Emergency Use Authorization (EUA). This EUA will remain in effect (meaning this test can be used) for the duration of the COVID-19 declaration under Section 564(b)(1) of the Act, 21 U.S.C. section 360bbb-3(b)(1), unless the authorization is terminated or revoked.  Performed at [facility], [address] [place], [place] [address]   Blood culture (routine x 2)     Status: None   Collection Time: 10/30/23  4:25 PM   Specimen: BLOOD  Result Value Ref Range Status   Specimen Description   Final    BLOOD RIGHT ANTECUBITAL Performed at [facility], [address] [place], [place] [address]    Special Requests   Final    BOTTLES DRAWN AEROBIC AND ANAEROBIC Blood Culture adequate volume Performed at [facility], [address] [place], [place] [address]    Culture   Final    NO GROWTH 5 DAYS Performed at [facility], [address], [place], [place] [address]    Report Status 11/04/2023 FINAL  Final  Blood culture (routine x 2)     Status: None   Collection Time: 10/30/23  4:25 PM   Specimen: BLOOD LEFT FOREARM  Result Value Ref Range Status   Specimen Description   Final    BLOOD LEFT FOREARM Performed at [facility], [address] [place], [place] [address]    Special Requests   Final    BOTTLES DRAWN AEROBIC AND ANAEROBIC Blood Culture adequate volume Performed at [facility], [address] [place], [place] [address]    Culture   Final    NO GROWTH 5 DAYS Performed at [facility], [address] [place], [place] [address]    Report Status 11/04/2023 FINAL  Final  MRSA Next Gen by PCR, Nasal     Status: None   Collection Time: 10/30/23  9:06 PM   Specimen: Nasal Mucosa; Nasal Swab  Result Value Ref Range Status   MRSA by PCR Next Gen NOT DETECTED NOT DETECTED Final    Comment: (NOTE) The GeneXpert MRSA Assay (FDA approved for NASAL specimens only), is one component of a comprehensive MRSA colonization surveillance program. It is not intended to diagnose MRSA infection nor to guide or monitor treatment for MRSA infections. Test performance is not FDA approved in patients less than 79 years old. Performed at [facility], [address], [place], [place] [address]   Culture, Respiratory w Gram Stain     Status: None   Collection Time: 10/31/23  2:34 AM   Specimen: Tracheal Aspirate; Respiratory  Result Value Ref Range Status   Specimen Description TRACHEAL ASPIRATE  Final   Special Requests NONE  Final   Gram Stain   Final    FEW WBC PRESENT, PREDOMINANTLY PMN NO ORGANISMS SEEN    Culture   Final    NO GROWTH 2 DAYS Performed at [facility], [address], [place], [place] [address]    Report Status 11/02/2023 FINAL  Final    Radiology Reports DG Chest Port 1 View Result Date: 11/06/2023 CLINICAL DATA:  48 year old male with history of shortness of breath. Unresponsive patient. EXAM: PORTABLE CHEST 1 VIEW COMPARISON:  Chest x-ray 11/05/2023. FINDINGS: Lung volumes are normal. Diffuse interstitial prominence and peribronchial cuffing. No consolidative airspace disease. No pleural effusions. No pneumothorax. No pulmonary nodule or mass noted. Pulmonary vasculature and the cardiomediastinal silhouette are within normal limits. IMPRESSION: 1. The appearance of the chest is concerning for an acute bronchitis. Electronically Signed   By: Trudie Reed M.D.   On: 11/06/2023 07:20   DG CHEST PORT 1 VIEW Result Date: 11/05/2023 CLINICAL DATA:  ARDS. EXAM: PORTABLE CHEST 1 VIEW COMPARISON:  11/03/2023 FINDINGS: Left-sided PICC line unchanged with tip over the right atrium as this could be pulled back proximally 7-8 cm. Lungs are adequately inflated with minimal patchy hazy density over the mid lungs and lung bases demonstrate moderate interval improvement. No effusion or pneumothorax. Cardiomediastinal silhouette and remainder of the exam is unchanged. IMPRESSION: 1. Moderate interval improvement in minimal patchy hazy density over the mid lungs and lung bases. 2. Left-sided PICC line unchanged with tip over the right atrium as this could be pulled back proximally 7-8 cm. Electronically Signed   By: Elberta Fortis M.D.   On: 11/05/2023 08:01   DG Abd 1 View Result Date: 11/04/2023 CLINICAL DATA:  Abdominal distension. EXAM: ABDOMEN - 1 VIEW COMPARISON:  None Available. FINDINGS: The bowel gas pattern is normal. No radio-opaque calculi or other significant radiographic abnormality are seen. IMPRESSION: No abnormal bowel dilatation. Electronically Signed   By: Lupita Raider M.D.   On: 11/04/2023 13:21   DG Chest Port 1 View Result Date: 11/03/2023 CLINICAL DATA:  5626 Acute respiratory failure (HCC) 5626 EXAM: PORTABLE CHEST 1 VIEW COMPARISON:  10/31/2023 FINDINGS: ET tube terminates 3.2 cm above the carina. Enteric tube terminates at the level of the GE junction. Left PICC line terminates at the level of the right atrium. Stable heart size. Persistent bilateral airspace opacities, now more confluent within the right lung base. No pneumothorax. IMPRESSION: 1. Persistent bilateral airspace opacities, now more confluent within the right lung base. 2. Enteric tube terminates at the level of the GE junction. Recommend advancement of at least 10 cm. Electronically Signed   By: Duanne Guess D.O.   On: 11/03/2023 08:40      Signature  -   Susa Raring M.D on 11/06/2023 at 8:07 AM   -  To page go to [email or web address]

## 2023-11-06 NOTE — Plan of Care (Signed)

## 2023-11-07 ENCOUNTER — Other Ambulatory Visit (HOSPITAL_COMMUNITY): Payer: Self-pay

## 2023-11-07 DIAGNOSIS — J80 Acute respiratory distress syndrome: Secondary | ICD-10-CM | POA: Diagnosis not present

## 2023-11-07 LAB — GLUCOSE, CAPILLARY: Glucose-Capillary: 192 mg/dL — ABNORMAL HIGH (ref 70–99)

## 2023-11-07 MED ORDER — LANCET DEVICE MISC: 1.0000 | Freq: Three times a day (TID) | 0 refills | Status: AC

## 2023-11-07 MED ORDER — CARVEDILOL 6.25 MG PO TABS: 6.2500 mg | ORAL_TABLET | Freq: Two times a day (BID) | ORAL | 0 refills | Status: AC

## 2023-11-07 MED ORDER — LANCET DEVICE MISC
1.0000 | Freq: Three times a day (TID) | 0 refills | Status: DC
  Filled 2023-11-07: qty 1, 30d supply, fill #0

## 2023-11-07 MED ORDER — BLOOD GLUCOSE MONITOR SYSTEM W/DEVICE KIT
1.0000 | PACK | Freq: Three times a day (TID) | 0 refills | Status: DC
  Filled 2023-11-07: qty 1, 30d supply, fill #0

## 2023-11-07 MED ORDER — FOLIC ACID 1 MG PO TABS: 1.0000 mg | ORAL_TABLET | Freq: Every day | ORAL | 0 refills | Status: AC

## 2023-11-07 MED ORDER — INSULIN PEN NEEDLE 32G X 4 MM MISC
0 refills | Status: DC
  Filled 2023-11-07: qty 30, fill #0
  Filled 2023-11-07: qty 100, 25d supply, fill #0

## 2023-11-07 MED ORDER — FOLIC ACID 1 MG PO TABS
1.0000 mg | ORAL_TABLET | Freq: Every day | ORAL | 0 refills | Status: DC
  Filled 2023-11-07: qty 30, 30d supply, fill #0

## 2023-11-07 MED ORDER — INSULIN ASPART 100 UNIT/ML FLEXPEN: PEN_INJECTOR | SUBCUTANEOUS | 0 refills | Status: AC

## 2023-11-07 MED ORDER — BLOOD GLUCOSE TEST VI STRP: 1.0000 | ORAL_STRIP | Freq: Three times a day (TID) | 0 refills | Status: AC

## 2023-11-07 MED ORDER — METFORMIN HCL 500 MG PO TABS
500.0000 mg | ORAL_TABLET | Freq: Two times a day (BID) | ORAL | 0 refills | Status: DC
  Filled 2023-11-07: qty 60, 30d supply, fill #0

## 2023-11-07 MED ORDER — THIAMINE HCL 100 MG PO TABS
100.0000 mg | ORAL_TABLET | Freq: Every day | ORAL | 0 refills | Status: DC
  Filled 2023-11-07: qty 30, 30d supply, fill #0

## 2023-11-07 MED ORDER — INSULIN PEN NEEDLE 32G X 4 MM MISC: 0 refills | Status: AC

## 2023-11-07 MED ORDER — METFORMIN HCL 500 MG PO TABS: 500.0000 mg | ORAL_TABLET | Freq: Two times a day (BID) | ORAL | 0 refills | Status: AC

## 2023-11-07 MED ORDER — CARVEDILOL 6.25 MG PO TABS
6.2500 mg | ORAL_TABLET | Freq: Two times a day (BID) | ORAL | 0 refills | Status: DC
  Filled 2023-11-07: qty 60, 30d supply, fill #0

## 2023-11-07 MED ORDER — ISOSORBIDE MONONITRATE ER 30 MG PO TB24
30.0000 mg | ORAL_TABLET | Freq: Every day | ORAL | 0 refills | Status: DC
  Filled 2023-11-07: qty 30, 30d supply, fill #0

## 2023-11-07 MED ORDER — INSULIN ASPART 100 UNIT/ML FLEXPEN
PEN_INJECTOR | SUBCUTANEOUS | 0 refills | Status: DC
  Filled 2023-11-07: qty 15, 30d supply, fill #0

## 2023-11-07 MED ORDER — SODIUM CHLORIDE 0.9 % IV SOLN: INTRAVENOUS | Status: AC

## 2023-11-07 MED ORDER — ISOSORBIDE MONONITRATE ER 30 MG PO TB24
30.0000 mg | ORAL_TABLET | Freq: Every day | ORAL | Status: DC
  Filled 2023-11-07: qty 1

## 2023-11-07 MED ORDER — ACCU-CHEK SOFTCLIX LANCETS MISC
1.0000 | Freq: Three times a day (TID) | 0 refills | Status: DC
  Filled 2023-11-07: qty 100, 30d supply, fill #0

## 2023-11-07 MED ORDER — BLOOD GLUCOSE TEST VI STRP
1.0000 | ORAL_STRIP | Freq: Three times a day (TID) | 0 refills | Status: DC
  Filled 2023-11-07: qty 100, 34d supply, fill #0

## 2023-11-07 MED ORDER — ISOSORBIDE MONONITRATE ER 30 MG PO TB24: 30.0000 mg | ORAL_TABLET | Freq: Every day | ORAL | 0 refills | Status: AC

## 2023-11-07 MED ORDER — ACCU-CHEK SOFTCLIX LANCETS MISC: 1.0000 | Freq: Three times a day (TID) | 0 refills | Status: AC

## 2023-11-07 MED ORDER — THIAMINE HCL 100 MG PO TABS: 100.0000 mg | ORAL_TABLET | Freq: Every day | ORAL | 0 refills | Status: AC

## 2023-11-07 MED ORDER — BLOOD GLUCOSE MONITOR SYSTEM W/DEVICE KIT
1.0000 | PACK | Freq: Three times a day (TID) | 0 refills | Status: AC
Start: 2023-11-07 — End: ?

## 2023-11-07 NOTE — TOC Transition Note (Signed)
Transition of Care [facility]) - Discharge Note   Patient Details  Name: Dale Clark MRN: [id number] Date of Birth: 05-07-1976  [facility] CM/SW Contact:  Gordy Clement, RN Phone Number: 11/07/2023, 9:53 AM   Clinical Narrative:     Patient will DC to home today. Patient will go to  [facility] as no accepting home health agencies. RW will be delivered bedside  Wife will transport  No Additional TOC needs         Barriers to Discharge: Continued Medical Work up   Patient Goals and CMS Choice            Discharge Placement                       Discharge Plan and Services Additional resources added to the After Visit Summary for   In-house Referral: Clinical Social Work                                   Social Drivers of Health (SDOH) Interventions SDOH Screenings   Food Insecurity: No Food Insecurity (10/31/2023)  Housing: Low Risk  (10/31/2023)  Transportation Needs: No Transportation Needs (10/31/2023)  Utilities: Not At Risk (10/31/2023)  Financial Resource Strain: Low Risk  (08/23/2023)   [facility]  Social Connections: Socially Isolated (10/31/2023)  Tobacco Use: High Risk (10/30/2023)     Readmission Risk Interventions     No data to display

## 2023-11-07 NOTE — Discharge Summary (Signed)
Dale Clark UEA:[id number] DOB: June 21, 1976 DOA: 10/30/2023  PCP: Norm Salt, PA  Admit date: 10/30/2023  Discharge date: 11/07/2023  Admitted From: Home   Disposition:  Home   Recommendations for Outpatient Follow-up:   Follow up with PCP in 1-2 weeks  PCP Please obtain BMP/CBC, 2 view CXR in 1week,  (see Discharge instructions)   PCP Please follow up on the following pending results:    Home Health: None   Equipment/Devices: None  Consultations: PCCM Discharge Condition: Stable    CODE STATUS: Full    Diet Recommendation: Heart Healthy Low Carb   Chief Complaint  Patient presents with   Shortness of Breath     Brief history of present illness from the day of admission and additional interim summary    48 yo M PMH PE, polyclonal gammopathy, thrombocytosis, hepatic steatosis, tobacco use, etoh use, previous PE 2015 no longer on [facility] who presented to [facility] 1/12 w SOB, general sick sx beginning 2 d ago. Checked O2 at home and was in the 80s, Temp was 102. Found to have flu A PNA. His O2 needs escalated and required BiPAP support. CXR w/ b/l opacities R >L. CTA chest: no PE; possible pulmonary edema vs. Multifocal pneumonia .  He was admitted to ICU intubated, he was treated with Tamiflu and antibiotics.  Finished all treatment was transition to heated high flow and extubated on 11/03/2023 and finally transferred to my care on 11/06/2023 on day 7 of hospital stay.  Currently he has finished all antibiotic treatments along with Tamiflu treatment.     Significant Hospital Events:    1/12 Flu A PNA with progressive O2 needs on bipap, intubated overnight  1/14 art line discontinued, as it was dysfunctional; titrated off levophed 1/16 extubated to [facility] Course    Acute hypoxic respiratory failure with severe ARDS 2/2 flu pneumonia with some suspicion of superimposed bacterial pneumonia as well.  Was admitted to ICU by PCCM remained intubated for few days, finally extubated 11/03/2023 and transition to heated high flow, has finished his Tamiflu and antibiotic treatment.  Clinically much improved currently on 4 L nasal cannula oxygen, advance activity titrate down oxygen.  Encouraged to sit in chair use I-S and flutter valve for pulmonary toiletry.  Now symptom-free on room air, does not want to stay anymore and wants to be discharged immediately.  He has finished his pneumonia specific treatment.  Currently no wheezing, clear lungs, stable on room air will be discharged home with outpatient follow-up with PCP and pulmonary.   AKI - due to sepsis, resolved resolved   HX of smoking and alcohol use disorder -  Drinks vodka daily, last drink 1/10,  Continue multivitamin, folic acid, and thiamine.  No DTs.  Counseled to quit all.   HTN  -in poor control on combination of Norvasc, ARB, added Imdur and Coreg for better control.   Hx of polyclonal gammopathy & Hx of thrombocytosis - platelets remain wnl , sees heme [facility]   T2DM - A1c 8.4%, -resume home dose Glucophage along with sliding scale insulin, testing supplies provided, PCP to monitor glycemic control add long-acting insulin if needed, he will be given diabetic and insulin teaching.  CBG (last 3)  Recent Labs    11/06/23 1332 11/06/23 1715 11/06/23 2129  GLUCAP 119* 175* 103*      Discharge diagnosis     Principal Problem:   ARDS (adult respiratory distress syndrome) (HCC) Active Problems:   Acute respiratory failure with hypoxia (HCC)   Acute respiratory failure with hypoxemia (HCC)   Severe sepsis [facility])    Discharge instructions    Discharge Instructions     Discharge instructions   Complete by: As directed    Follow with Primary MD Norm Salt,  PA in 7 days   Get CBC, CMP, 2 view Chest X ray -  checked next visit with your primary MD    Activity: As tolerated with Full fall precautions use walker/cane & assistance as needed  Disposition Home    Diet: Heart Healthy  Low Carb  Accuchecks 4 times/day, Once in AM empty stomach and then before each meal. Log in all results and show them to your Prim.MD in 3 days. If any glucose reading is under 80 or above 300 call your Prim MD immidiately. Follow Low glucose instructions for glucose under 80 as instructed.   Special Instructions: If you have smoked or chewed Tobacco  in the last 2 yrs please stop smoking, stop any regular Alcohol  and or any Recreational drug use.  On your next visit with your primary care physician please Get Medicines reviewed and adjusted.  Please request your Prim.MD to go over all Hospital Tests and Procedure/Radiological results at the follow up, please get all Hospital records sent to your Prim MD by signing hospital release before you go home.  If you experience worsening of your admission symptoms, develop shortness of breath, life threatening emergency, suicidal or homicidal thoughts you must seek medical attention immediately by calling 911 or calling your MD immediately  if symptoms less severe.  You Must read complete instructions/literature along with all the possible adverse reactions/side effects for all the Medicines you take and that have been prescribed to you. Take any new Medicines after you have completely understood and accpet all the possible adverse reactions/side effects.   Do not drive when taking Pain medications.  Do not take more than prescribed Pain, Sleep and Anxiety Medications   Increase activity slowly   Complete by: As directed        Discharge Medications   Allergies as of 11/07/2023   No Known Allergies      Medication List     STOP taking these medications    DAYQUIL PO   THERAFLU COLD & COUGH PO        TAKE these medications    amLODipine-olmesartan 5-20 MG tablet Commonly known as: AZOR Take 1 tablet by mouth daily.   Blood Glucose Monitoring Suppl Devi 1 each by Does not apply route in the morning, at noon, and at bedtime. May substitute to any manufacturer covered by patient's insurance.  BLOOD GLUCOSE TEST STRIPS Strp 1 each by In Vitro route in the morning, at noon, and at bedtime. May substitute to any manufacturer covered by patient's insurance.   carvedilol 6.25 MG tablet Commonly known as: COREG Take 1 tablet (6.25 mg total) by mouth 2 (two) times daily with a meal.   folic acid 1 MG tablet Commonly known as: FOLVITE Take 1 tablet (1 mg total) by mouth daily.   ibuprofen 200 MG tablet Commonly known as: ADVIL Take 800 mg by mouth every 6 (six) hours as needed for moderate pain (pain score 4-6).   insulin aspart 100 UNIT/ML FlexPen Commonly known as: NOVOLOG Before each meal 3 times a day, 140-199 - 2 units, 200-250 - 4 units, 251-299 - 6 units,  300-349 - 8 units,  350 or above 10 units.   Insulin Syringe-Needle U-100 25G X 1" 1 ML Misc For 4 times a day insulin SQ, 1 month supply. Diagnosis E11.65   isosorbide mononitrate 30 MG 24 hr tablet Commonly known as: IMDUR Take 1 tablet (30 mg total) by mouth daily.   Lancet Device Misc 1 each by Does not apply route in the morning, at noon, and at bedtime. May substitute to any manufacturer covered by patient's insurance.   Lancets Misc. Misc 1 each by Does not apply route in the morning, at noon, and at bedtime. May substitute to any manufacturer covered by patient's insurance.   metFORMIN 500 MG tablet Commonly known as: GLUCOPHAGE Take 1 tablet (500 mg total) by mouth 2 (two) times daily.   MUCINEX PO Take 1 tablet by mouth 2 (two) times daily as needed (Cough/Cold).   thiamine 100 MG tablet Commonly known as: Vitamin B-1 Take 1 tablet (100 mg total) by mouth daily.         Follow-up Information      Norm Salt, [place]. Schedule an appointment as soon as possible for a visit in 1 week(s).   Specialty: Physician Assistant Contact information: [address] [place] [address] [phone]         Hunsucker, Lesia Sago, MD. Schedule an appointment as soon as possible for a visit in 1 week(s).   Specialty: Pulmonary Disease Contact information: [address] [place] [address] [phone]                 Major procedures and Radiology Reports - PLEASE review detailed and final reports thoroughly  -       DG Chest [facility] 1 View Result Date: 11/06/2023 CLINICAL DATA:  48 year old male with history of shortness of breath. Unresponsive patient. EXAM: PORTABLE CHEST 1 VIEW COMPARISON:  Chest x-ray 11/05/2023. FINDINGS: Lung volumes are normal. Diffuse interstitial prominence and peribronchial cuffing. No consolidative airspace disease. No pleural effusions. No pneumothorax. No pulmonary nodule or mass noted. Pulmonary vasculature and the cardiomediastinal silhouette are within normal limits. IMPRESSION: 1. The appearance of the chest is concerning for an acute bronchitis. Electronically Signed   By: Trudie Reed M.D.   On: 11/06/2023 07:20   DG CHEST PORT 1 VIEW Result Date: 11/05/2023 CLINICAL DATA:  ARDS. EXAM: PORTABLE CHEST 1 VIEW COMPARISON:  11/03/2023 FINDINGS: Left-sided PICC line unchanged with tip over the right atrium as this could be pulled back proximally 7-8 cm. Lungs are adequately inflated with minimal patchy hazy density over the mid lungs and lung bases demonstrate moderate interval improvement. No effusion or pneumothorax. Cardiomediastinal silhouette and remainder of the exam is unchanged. IMPRESSION: 1. Moderate  interval improvement in minimal patchy hazy density over the mid lungs and lung bases. 2. Left-sided PICC line unchanged with tip over the right atrium as this could be pulled back proximally 7-8 cm. Electronically  Signed   By: Elberta Fortis M.D.   On: 11/05/2023 08:01   DG Abd 1 View Result Date: 11/04/2023 CLINICAL DATA:  Abdominal distension. EXAM: ABDOMEN - 1 VIEW COMPARISON:  None Available. FINDINGS: The bowel gas pattern is normal. No radio-opaque calculi or other significant radiographic abnormality are seen. IMPRESSION: No abnormal bowel dilatation. Electronically Signed   By: Lupita Raider M.D.   On: 11/04/2023 13:21   DG Chest Port 1 View Result Date: 11/03/2023 CLINICAL DATA:  5626 Acute respiratory failure (HCC) 5626 EXAM: PORTABLE CHEST 1 VIEW COMPARISON:  10/31/2023 FINDINGS: ET tube terminates 3.2 cm above the carina. Enteric tube terminates at the level of the GE junction. Left PICC line terminates at the level of the right atrium. Stable heart size. Persistent bilateral airspace opacities, now more confluent within the right lung base. No pneumothorax. IMPRESSION: 1. Persistent bilateral airspace opacities, now more confluent within the right lung base. 2. Enteric tube terminates at the level of the GE junction. Recommend advancement of at least 10 cm. Electronically Signed   By: Duanne Guess D.O.   On: 11/03/2023 08:40   DG CHEST PORT 1 VIEW Result Date: 10/31/2023 CLINICAL DATA:  Respiratory failure EXAM: PORTABLE CHEST 1 VIEW COMPARISON:  Same day chest x-ray FINDINGS: ET tube terminates 5.0 cm above the carina. Enteric tube extends into the stomach. New left-sided PICC line terminates at the level of the right atrium. Stable heart size. Bilateral airspace opacities with slightly improving aeration compared to prior. No pleural effusion or pneumothorax. IMPRESSION: 1. Bilateral airspace opacities with slightly improving aeration compared to prior. 2. New left-sided PICC line terminates at the level of the right atrium. Electronically Signed   By: Duanne Guess D.O.   On: 10/31/2023 17:14   [place] EKG SITE RITE Result Date: 10/31/2023 If Site Rite image not attached, placement could not be  confirmed due to current cardiac rhythm.  DG Chest Port 1 View Result Date: 10/31/2023 CLINICAL DATA:  ETT and OG tube placement EXAM: PORTABLE CHEST 1 VIEW COMPARISON:  10/31/2023 at 0156 hours FINDINGS: Multifocal patchy/perihilar opacities, right lower lobe predominant, favoring moderate interstitial edema. Suspected small right pleural effusion. No pneumothorax. The heart is normal in size. Endotracheal tube terminates 5.5 cm above the carina. Enteric tube terminates in the proximal stomach. IMPRESSION: Endotracheal tube terminates 5.5 cm above the carina. Enteric tube terminates in the proximal stomach. Moderate interstitial edema, right lower lobe predominant. Electronically Signed   By: Charline Bills M.D.   On: 10/31/2023 02:58   DG CHEST PORT 1 VIEW Result Date: 10/31/2023 CLINICAL DATA:  [address].  Acute respiratory distress. EXAM: PORTABLE CHEST 1 VIEW COMPARISON:  Portable chest yesterday at 3:48 p.m., CTA chest yesterday at 4:11 p.m. FINDINGS: 1:56 a.m. there is diffuse dense bilateral airspace disease, right-greater-than-left with relative peripheral sparing and minimal pleural effusions. The airspace opacities are increasingly more dense and confluent in the perihilar and lower zonal areas than previously. Findings may suggest worsening edema and/or pneumonia. The mediastinum is normally outlined. The cardiac size is normal. Central vessels are obscured by airspace disease. Negative thoracic cage. IMPRESSION: Diffuse dense bilateral airspace disease, right-greater-than-left with relative peripheral sparing and minimal pleural effusions. The opacities are increasingly more dense and confluent in the perihilar and lower zonal  areas than previously. Findings may suggest worsening edema and/or pneumonia. Clinical correlation and continued radiographic follow-up recommended. Electronically Signed   By: Almira Bar M.D.   On: 10/31/2023 02:09   CT Angio Chest PE W and/or Wo Contrast Result Date:  10/30/2023 CLINICAL DATA:  Positive D-dimer.  Short of breath.  Tachycardia. EXAM: CT ANGIOGRAPHY CHEST WITH CONTRAST TECHNIQUE: Multidetector CT imaging of the chest was performed using the standard protocol during bolus administration of intravenous contrast. Multiplanar CT image reconstructions and MIPs were obtained to evaluate the vascular anatomy. RADIATION DOSE REDUCTION: This exam was performed according to the departmental dose-optimization program which includes automated exposure control, adjustment of the mA and/or kV according to patient size and/or use of iterative reconstruction technique. CONTRAST:  OMNIPAQUE IOHEXOL 350 MG/ML SOLN COMPARISON:  None Available. FINDINGS: Cardiovascular: No filling defects within the pulmonary arteries to suggest acute pulmonary embolism. Mediastinum/Nodes: Small mediastinal lymph nodes are not pathologic by size criteria. There is multiple prominent small axillary nodes which are upper limits of normal at 10 mm. Skin thickening of the LEFT and RIGHT axilla (image 38/302, for example). Lungs/Pleura: No pulmonary infarction. There is diffuse airspace disease in perihilar distribution in the upper and lower lobes on the RIGHT. Upper Abdomen: Limited view of the liver, kidneys, pancreas are unremarkable. Normal adrenal glands. Musculoskeletal: No aggressive osseous lesion. Review of the MIP images confirms the above findings. IMPRESSION: 1. No evidence acute pulmonary embolism. 2. Large regions confluent perihilar airspace disease extending from the hilum and sparing the sub pleural lung. Differential would include pulmonary edema versus multifocal atypical pneumonia versus other inflammatory or toxic etiologies. 3. Bilateral mild axillary adenopathy with associated skin thickening. Findings are similar to CT 2016 and favored benign/chronic. Electronically Signed   By: Genevive Bi M.D.   On: 10/30/2023 16:38   DG Chest Portable 1 View Result Date:  10/30/2023 CLINICAL DATA:  Cough EXAM: PORTABLE CHEST 1 VIEW COMPARISON:  07/04/2015 FINDINGS: Heart size within normal limits. Extensive bilateral airspace opacities most pronounced within the bilateral perihilar and right basilar regions. No pleural effusion or pneumothorax. IMPRESSION: Extensive bilateral airspace opacities, right worse than left. This may represent multifocal pneumonia or pulmonary edema. Electronically Signed   By: Duanne Guess D.O.   On: 10/30/2023 16:11    Micro Results    Recent Results (from the past 240 hours)  Resp panel by RT-PCR (RSV, Flu A&B, Covid) Anterior Nasal Swab     Status: Abnormal   Collection Time: 10/30/23  2:56 PM   Specimen: Anterior Nasal Swab  Result Value Ref Range Status   SARS Coronavirus 2 by RT PCR NEGATIVE NEGATIVE Final    Comment: (NOTE) SARS-CoV-2 target nucleic acids are NOT DETECTED.  The SARS-CoV-2 RNA is generally detectable in upper respiratory specimens during the acute phase of infection. The lowest concentration of SARS-CoV-2 viral copies this assay can detect is 138 copies/mL. A negative result does not preclude SARS-Cov-2 infection and should not be used as the sole basis for treatment or other patient management decisions. A negative result may occur with  improper specimen collection/handling, submission of specimen other than nasopharyngeal swab, presence of viral mutation(s) within the areas targeted by this assay, and inadequate number of viral copies(<138 copies/mL). A negative result must be combined with clinical observations, patient history, and epidemiological information. The expected result is Negative.  Fact Sheet for Patients:  BloggerCourse.com  Fact Sheet for Healthcare Providers:  SeriousBroker.it  This test is no t yet approved  or cleared by the [place] and  has been authorized for detection and/or diagnosis of SARS-CoV-2 by FDA under  an Emergency Use Authorization (EUA). This EUA will remain  in effect (meaning this test can be used) for the duration of the COVID-19 declaration under Section 564(b)(1) of the Act, 21 U.S.C.section 360bbb-3(b)(1), unless the authorization is terminated  or revoked sooner.       Influenza A by PCR POSITIVE (A) NEGATIVE Final   Influenza B by PCR NEGATIVE NEGATIVE Final    Comment: (NOTE) The Xpert Xpress SARS-CoV-2/FLU/RSV plus assay is intended as an aid in the diagnosis of influenza from Nasopharyngeal swab specimens and should not be used as a sole basis for treatment. Nasal washings and aspirates are unacceptable for Xpert Xpress SARS-CoV-2/FLU/RSV testing.  Fact Sheet for Patients: BloggerCourse.com  Fact Sheet for Healthcare Providers: SeriousBroker.it  This test is not yet approved or cleared by the [place] FDA and has been authorized for detection and/or diagnosis of SARS-CoV-2 by FDA under an Emergency Use Authorization (EUA). This EUA will remain in effect (meaning this test can be used) for the duration of the COVID-19 declaration under Section 564(b)(1) of the Act, 21 U.S.C. section 360bbb-3(b)(1), unless the authorization is terminated or revoked.     Resp Syncytial Virus by PCR NEGATIVE NEGATIVE Final    Comment: (NOTE) Fact Sheet for Patients: BloggerCourse.com  Fact Sheet for Healthcare Providers: SeriousBroker.it  This test is not yet approved or cleared by the [place] FDA and has been authorized for detection and/or diagnosis of SARS-CoV-2 by FDA under an Emergency Use Authorization (EUA). This EUA will remain in effect (meaning this test can be used) for the duration of the COVID-19 declaration under Section 564(b)(1) of the Act, 21 U.S.C. section 360bbb-3(b)(1), unless the authorization is terminated or revoked.  Performed at [facility], [address] [place], [place] [address]   Blood culture (routine x 2)     Status: None   Collection Time: 10/30/23  4:25 PM   Specimen: BLOOD  Result Value Ref Range Status   Specimen Description   Final    BLOOD RIGHT ANTECUBITAL Performed at [facility], [address] [place], [place] [address]    Special Requests   Final    BOTTLES DRAWN AEROBIC AND ANAEROBIC Blood Culture adequate volume Performed at [facility], [address] [place], [place] [address]    Culture   Final    NO GROWTH 5 DAYS Performed at [facility], [address] [place], [place] [address]    Report Status 11/04/2023 FINAL  Final  Blood culture (routine x 2)     Status: None   Collection Time: 10/30/23  4:25 PM   Specimen: BLOOD LEFT FOREARM  Result Value Ref Range Status   Specimen Description   Final    BLOOD LEFT FOREARM Performed at [facility], [address] [place], [place] [address]    Special Requests   Final    BOTTLES DRAWN AEROBIC AND ANAEROBIC Blood Culture adequate volume Performed at [facility], [address] [place], [place] [address]    Culture   Final    NO GROWTH 5 DAYS Performed at [facility], [address] [place], [place] [address]    Report Status 11/04/2023 FINAL  Final  MRSA Next Gen by PCR, Nasal     Status: None  Collection Time: 10/30/23  9:06 PM   Specimen: Nasal Mucosa; Nasal Swab  Result Value Ref Range Status   MRSA by PCR Next Gen NOT DETECTED NOT DETECTED Final    Comment: (NOTE) The GeneXpert MRSA Assay (FDA approved for NASAL specimens only), is one component of a comprehensive MRSA colonization surveillance program. It is not intended to diagnose MRSA infection nor to guide or monitor treatment for MRSA infections. Test performance is not FDA approved in patients less than 99 years old. Performed at [facility], [address], [place], [place] [address]    Culture, Respiratory w Gram Stain     Status: None   Collection Time: 10/31/23  2:34 AM   Specimen: Tracheal Aspirate; Respiratory  Result Value Ref Range Status   Specimen Description TRACHEAL ASPIRATE  Final   Special Requests NONE  Final   Gram Stain   Final    FEW WBC PRESENT, PREDOMINANTLY PMN NO ORGANISMS SEEN    Culture   Final    NO GROWTH 2 DAYS Performed at [facility], [address] [place], [place] [address]    Report Status 11/02/2023 FINAL  Final    Today   Subjective    Dale Clark today has no headache,no chest abdominal pain,no new weakness tingling or numbness, feels much better wants to go home today.    Objective   Blood pressure (!) 144/83, pulse 94, temperature 98 F (36.7 C), temperature source Oral, resp. rate 18, height 5\' 10"  (1.778 m), weight 85 kg, SpO2 96%.   Intake/Output Summary (Last 24 hours) at 11/07/2023 0740 Last data filed at 11/06/2023 0900 Gross per 24 hour  Intake --  Output 300 ml  Net -300 ml    Exam  Awake Alert, No new F.N deficits,    [place].AT,PERRAL Supple Neck,   Symmetrical Chest wall movement, Good air movement bilaterally, CTAB RRR,No Gallops,   +ve B.Sounds, Abd Soft, Non tender,  No Cyanosis, Clubbing or edema    Data Review   Recent Labs  Lab 11/02/23 0418 11/03/23 0433 11/04/23 0335 11/05/23 0312 11/06/23 0610  WBC 11.7* 13.2* 17.1* 14.4* 10.2  HGB 11.0* 10.8* 11.5* 11.6* 11.7*  HCT 33.8* 33.1* 34.2* 34.5* 35.6*  PLT 182 191 259 329 349  MCV 100.6* 99.7 96.9 96.4 97.8  MCH 32.7 32.5 32.6 32.4 32.1  MCHC 32.5 32.6 33.6 33.6 32.9  RDW 13.0 13.1 12.5 12.3 12.0  LYMPHSABS  --   --   --   --  1.8  MONOABS  --   --   --   --  1.1*  EOSABS  --   --   --   --  0.3  BASOSABS  --   --   --   --  0.0    Recent Labs  Lab 10/31/23 0815 10/31/23 1026 10/31/23 1715 10/31/23 2338 11/01/23 0541 11/01/23 1547 11/01/23 1808 11/02/23 0418 11/03/23 0433 11/04/23 0335 11/05/23 0312  11/06/23 0610  NA  --    < >  --    < > 137   < >  --  139 141 139 136 136  K  --    < >  --    < > 4.3   < >  --  4.1 3.7 3.5 3.8 3.7  CL  --   --   --    < > 106  --   --  105 104 100 100 101  CO2  --   --   --    < >  24  --   --  26 27 27 26 25   ANIONGAP  --   --   --    < > 7  --   --  8 10 12 10 10   GLUCOSE  --   --   --    < > 171*  --   --  200* 119* 130* 169* 118*  BUN  --   --   --    < > 20  --   --  19 18 14 15 13   CREATININE  --   --   --    < > 1.33*  --   --  1.06 1.05 0.94 0.95 1.00  CRP  --   --   --   --   --   --   --   --   --   --   --  10.1*  PROCALCITON  --   --   --   --   --   --   --   --   --   --   --  0.87  LATICACIDVEN 2.1*  --   --   --   --   --   --   --   --   --   --   --   BNP  --   --   --   --   --   --   --   --   --   --   --  55.9  MG  --   --  3.2*  --  2.8*  --  2.5* 2.3  --   --   --  2.0  PHOS  --    < > 4.8*  --  2.9  --  2.4* 2.4* 4.1  --   --  3.2  CALCIUM  --   --   --    < > 7.7*  --   --  7.9* 8.0* 8.4* 8.8* 8.7*   < > = values in this interval not displayed.    Total Time in preparing paper work, data evaluation and todays exam - 35 minutes  Signature  -    Susa Raring M.D on 11/07/2023 at 7:40 AM   -  To page go to [email or web address]

## 2023-11-07 NOTE — Progress Notes (Signed)
PT Cancellation Note  Patient Details Name: Dale Clark MRN: [id number] DOB: [birth year]   Cancelled Treatment:    Reason Eval/Treat Not Completed: (P) Patient declined, no reason specified (Pt reports feeling comfortable with stairs and that he is ready to discharge. Pt declines stair trial despite encouragement and education.)   Johny Shock 11/07/2023, 10:11 AM

## 2023-11-07 NOTE — Discharge Instructions (Signed)
Follow with Primary MD Norm Salt, PA in 7 days   Get CBC, CMP, 2 view Chest X ray -  checked next visit with your primary MD    Activity: As tolerated with Full fall precautions use walker/cane & assistance as needed  Disposition Home    Diet: Heart Healthy    Special Instructions: If you have smoked or chewed Tobacco  in the last 2 yrs please stop smoking, stop any regular Alcohol  and or any Recreational drug use.  On your next visit with your primary care physician please Get Medicines reviewed and adjusted.  Please request your Prim.MD to go over all Hospital Tests and Procedure/Radiological results at the follow up, please get all Hospital records sent to your Prim MD by signing hospital release before you go home.  If you experience worsening of your admission symptoms, develop shortness of breath, life threatening emergency, suicidal or homicidal thoughts you must seek medical attention immediately by calling 911 or calling your MD immediately  if symptoms less severe.  You Must read complete instructions/literature along with all the possible adverse reactions/side effects for all the Medicines you take and that have been prescribed to you. Take any new Medicines after you have completely understood and accpet all the possible adverse reactions/side effects.   Do not drive when taking Pain medications.  Do not take more than prescribed Pain, Sleep and Anxiety Medications

## 2023-11-07 NOTE — Plan of Care (Signed)

## 2023-11-07 NOTE — Inpatient Diabetes Management (Signed)
Inpatient Diabetes Program Recommendations  AACE/ADA: New Consensus Statement on Inpatient Glycemic Control (2015)  Target Ranges:  Prepandial:   less than 140 mg/dL      Peak postprandial:   less than 180 mg/dL (1-2 hours)      Critically ill patients:  140 - 180 mg/dL   Lab Results  Component Value Date   GLUCAP 192 (H) 11/07/2023   HGBA1C 8.4 (H) 10/30/2023    Latest Reference Range & Units 11/06/23 10:57 11/06/23 13:32 11/06/23 17:15 11/06/23 21:29 11/07/23 08:40  Glucose-Capillary 70 - 99 mg/dL 401 (H) 027 (H) 253 (H) 103 (H) 192 (H)  (H): Data is abnormally high  Diabetes history: DM2 Outpatient Diabetes medications: Metformin 500 mg daily Current orders for Inpatient glycemic control: Novolog 0-15 units tid, 0-5 units hs  Inpatient Diabetes Program Recommendations:   Received consult regarding insulin teaching for Novolog sliding scale for home use. Educated patient and spouse on insulin pen use at home. Reviewed contents of insulin flexpen starter kit. Reviewed all steps if insulin pen including attachment of needle, 2-unit air shot, dialing up dose, giving injection, removing needle, disposal of sharps, storage of unused insulin, disposal of insulin etc. Patient able to provide successful return demonstration. Also reviewed troubleshooting with insulin pen. MD to give patient Rxs for insulin pens and insulin pen needles.  Reviewed with patient use of Novolog sliding scale and importance of taking log to PCP office for followup. Patient wears a CGM @ home and has current supplies. Patient has been taking his Metformin 500 mg daily @ home prior to admission.  Reviewed basic plate method. Patient and wife states patient limits his carbohydrates and sugars @ home and exercises regularly.  Thank you, Billy Fischer. Eleena Grater, RN, MSN, CDCES  Diabetes Coordinator Inpatient Glycemic Control Team Team Pager [phone] (8am-5pm) 11/07/2023 9:58 AM

## 2023-11-23 ENCOUNTER — Encounter: Payer: Self-pay | Admitting: Pulmonary Disease

## 2023-11-23 ENCOUNTER — Ambulatory Visit (INDEPENDENT_AMBULATORY_CARE_PROVIDER_SITE_OTHER): Payer: No Typology Code available for payment source | Admitting: Pulmonary Disease

## 2023-11-23 ENCOUNTER — Telehealth: Payer: Self-pay | Admitting: Pulmonary Disease

## 2023-11-23 VITALS — BP 128/48 | HR 107 | Ht 70.0 in | Wt 189.8 lb

## 2023-11-23 DIAGNOSIS — J80 Acute respiratory distress syndrome: Secondary | ICD-10-CM

## 2023-11-23 MED ORDER — IPRATROPIUM-ALBUTEROL 0.5-2.5 (3) MG/3ML IN SOLN: 3.0000 mL | Freq: Four times a day (QID) | RESPIRATORY_TRACT | 2 refills | Status: AC | PRN

## 2023-11-23 NOTE — Patient Instructions (Signed)
 Keep increasing activity  Will get a CT scan in about 6 months to follow-up and make sure everything is healing  Return to clinic in 6 months after CT scan

## 2023-11-23 NOTE — Telephone Encounter (Signed)
 Patient in office for visit on 2/5. Gave FMLA forms to [employer] who gave to [facility] to put into [name]'s box.

## 2023-11-23 NOTE — Progress Notes (Signed)
 @Patient  ID: Dale Clark, male    DOB: 21-Oct-1975, 48 y.o.   MRN: [id number]  Chief Complaint  Patient presents with   Follow-up    PS states he was in hospital for 9 days     Referring provider: Rosalea Rosina SAILOR, PA  HPI:   48 y.o. man whom are seen for hospital follow-up for flu ARDS.  Multiple hospital notes reviewed.  Discharge summary reviewed.  Admitted with acute onset respiratory distress.  Hypoxemia.  Test positive for flu.  Got worse despite aggressive oxygen.  Was intubated.  Severe ARDS with PF ratio less than 100.  Slowly improved with deep sedation.  Never proned.  Slowly improved over time.  Treated with Tamiflu .  Antibiotic for possible community-acquired pneumonia superimposed.  No steroids given given etiology was flu.  Eventually was diuresed given fluid retention.  Other organs hung in.  He was subsequent extubated to heated high flow.  Gradually improved.  Discharged on 4 L nasal cannula.  Overall he is doing well.  Slowly increasing exercise.  On room air.  Certainly is dyspneic compared to prior.  With that is improving as well.  He is encouraged by this.  Questionaires / Pulmonary Flowsheets:   ACT:      No data to display          MMRC:     No data to display          Epworth:      No data to display          Tests:   FENO:  No results found for: NITRICOXIDE  PFT:     No data to display          WALK:      No data to display          Imaging: Personally reviewed and as per EMR and discussion in this note DG Chest Port 1 View Result Date: 11/06/2023 CLINICAL DATA:  48 year old male with history of shortness of breath. Unresponsive patient. EXAM: PORTABLE CHEST 1 VIEW COMPARISON:  Chest x-ray 11/05/2023. FINDINGS: Lung volumes are normal. Diffuse interstitial prominence and peribronchial cuffing. No consolidative airspace disease. No pleural effusions. No pneumothorax. No pulmonary nodule or mass noted. Pulmonary  vasculature and the cardiomediastinal silhouette are within normal limits. IMPRESSION: 1. The appearance of the chest is concerning for an acute bronchitis. Electronically Signed   By: Toribio Aye M.D.   On: 11/06/2023 07:20   DG CHEST PORT 1 VIEW Result Date: 11/05/2023 CLINICAL DATA:  ARDS. EXAM: PORTABLE CHEST 1 VIEW COMPARISON:  11/03/2023 FINDINGS: Left-sided PICC line unchanged with tip over the right atrium as this could be pulled back proximally 7-8 cm. Lungs are adequately inflated with minimal patchy hazy density over the mid lungs and lung bases demonstrate moderate interval improvement. No effusion or pneumothorax. Cardiomediastinal silhouette and remainder of the exam is unchanged. IMPRESSION: 1. Moderate interval improvement in minimal patchy hazy density over the mid lungs and lung bases. 2. Left-sided PICC line unchanged with tip over the right atrium as this could be pulled back proximally 7-8 cm. Electronically Signed   By: Toribio Agreste M.D.   On: 11/05/2023 08:01   DG Abd 1 View Result Date: 11/04/2023 CLINICAL DATA:  Abdominal distension. EXAM: ABDOMEN - 1 VIEW COMPARISON:  None Available. FINDINGS: The bowel gas pattern is normal. No radio-opaque calculi or other significant radiographic abnormality are seen. IMPRESSION: No abnormal bowel dilatation. Electronically Signed   By: Lynwood  Landy Clark M.D.   On: 11/04/2023 13:21   DG Chest Port 1 View Result Date: 11/03/2023 CLINICAL DATA:  5626 Acute respiratory failure (HCC) 5626 EXAM: PORTABLE CHEST 1 VIEW COMPARISON:  10/31/2023 FINDINGS: ET tube terminates 3.2 cm above the carina. Enteric tube terminates at the level of the GE junction. Left PICC line terminates at the level of the right atrium. Stable heart size. Persistent bilateral airspace opacities, now more confluent within the right lung base. No pneumothorax. IMPRESSION: 1. Persistent bilateral airspace opacities, now more confluent within the right lung base. 2. Enteric tube  terminates at the level of the GE junction. Recommend advancement of at least 10 cm. Electronically Signed   By: Mabel Converse D.O.   On: 11/03/2023 08:40   DG CHEST PORT 1 VIEW Result Date: 10/31/2023 CLINICAL DATA:  Respiratory failure EXAM: PORTABLE CHEST 1 VIEW COMPARISON:  Same day chest x-ray FINDINGS: ET tube terminates 5.0 cm above the carina. Enteric tube extends into the stomach. New left-sided PICC line terminates at the level of the right atrium. Stable heart size. Bilateral airspace opacities with slightly improving aeration compared to prior. No pleural effusion or pneumothorax. IMPRESSION: 1. Bilateral airspace opacities with slightly improving aeration compared to prior. 2. New left-sided PICC line terminates at the level of the right atrium. Electronically Signed   By: Mabel Converse D.O.   On: 10/31/2023 17:14   US  EKG SITE RITE Result Date: 10/31/2023 If Site Rite image not attached, placement could not be confirmed due to current cardiac rhythm.  DG Chest Port 1 View Result Date: 10/31/2023 CLINICAL DATA:  ETT and OG tube placement EXAM: PORTABLE CHEST 1 VIEW COMPARISON:  10/31/2023 at 0156 hours FINDINGS: Multifocal patchy/perihilar opacities, right lower lobe predominant, favoring moderate interstitial edema. Suspected small right pleural effusion. No pneumothorax. The heart is normal in size. Endotracheal tube terminates 5.5 cm above the carina. Enteric tube terminates in the proximal stomach. IMPRESSION: Endotracheal tube terminates 5.5 cm above the carina. Enteric tube terminates in the proximal stomach. Moderate interstitial edema, right lower lobe predominant. Electronically Signed   By: Pinkie Pebbles M.D.   On: 10/31/2023 02:58   DG CHEST PORT 1 VIEW Result Date: 10/31/2023 CLINICAL DATA:  33497.  Acute respiratory distress. EXAM: PORTABLE CHEST 1 VIEW COMPARISON:  Portable chest yesterday at 3:48 p.m., CTA chest yesterday at 4:11 p.m. FINDINGS: 1:56 a.m. there is  diffuse dense bilateral airspace disease, right-greater-than-left with relative peripheral sparing and minimal pleural effusions. The airspace opacities are increasingly more dense and confluent in the perihilar and lower zonal areas than previously. Findings may suggest worsening edema and/or pneumonia. The mediastinum is normally outlined. The cardiac size is normal. Central vessels are obscured by airspace disease. Negative thoracic cage. IMPRESSION: Diffuse dense bilateral airspace disease, right-greater-than-left with relative peripheral sparing and minimal pleural effusions. The opacities are increasingly more dense and confluent in the perihilar and lower zonal areas than previously. Findings may suggest worsening edema and/or pneumonia. Clinical correlation and continued radiographic follow-up recommended. Electronically Signed   By: Francis Quam M.D.   On: 10/31/2023 02:09   CT Angio Chest PE W and/or Wo Contrast Result Date: 10/30/2023 CLINICAL DATA:  Positive D-dimer.  Short of breath.  Tachycardia. EXAM: CT ANGIOGRAPHY CHEST WITH CONTRAST TECHNIQUE: Multidetector CT imaging of the chest was performed using the standard protocol during bolus administration of intravenous contrast. Multiplanar CT image reconstructions and MIPs were obtained to evaluate the vascular anatomy. RADIATION DOSE REDUCTION: This exam was performed  according to the departmental dose-optimization program which includes automated exposure control, adjustment of the mA and/or kV according to patient size and/or use of iterative reconstruction technique. CONTRAST:  OMNIPAQUE  IOHEXOL  350 MG/ML SOLN COMPARISON:  None Available. FINDINGS: Cardiovascular: No filling defects within the pulmonary arteries to suggest acute pulmonary embolism. Mediastinum/Nodes: Small mediastinal lymph nodes are not pathologic by size criteria. There is multiple prominent small axillary nodes which are upper limits of normal at 10 mm. Skin  thickening of the LEFT and RIGHT axilla (image 38/302, for example). Lungs/Pleura: No pulmonary infarction. There is diffuse airspace disease in perihilar distribution in the upper and lower lobes on the RIGHT. Upper Abdomen: Limited view of the liver, kidneys, pancreas are unremarkable. Normal adrenal glands. Musculoskeletal: No aggressive osseous lesion. Review of the MIP images confirms the above findings. IMPRESSION: 1. No evidence acute pulmonary embolism. 2. Large regions confluent perihilar airspace disease extending from the hilum and sparing the sub pleural lung. Differential would include pulmonary edema versus multifocal atypical pneumonia versus other inflammatory or toxic etiologies. 3. Bilateral mild axillary adenopathy with associated skin thickening. Findings are similar to CT 2016 and favored benign/chronic. Electronically Signed   By: Jackquline Boxer M.D.   On: 10/30/2023 16:38   DG Chest Portable 1 View Result Date: 10/30/2023 CLINICAL DATA:  Cough EXAM: PORTABLE CHEST 1 VIEW COMPARISON:  07/04/2015 FINDINGS: Heart size within normal limits. Extensive bilateral airspace opacities most pronounced within the bilateral perihilar and right basilar regions. No pleural effusion or pneumothorax. IMPRESSION: Extensive bilateral airspace opacities, right worse than left. This may represent multifocal pneumonia or pulmonary edema. Electronically Signed   By: Mabel Converse D.O.   On: 10/30/2023 16:11    Lab Results: Personally reviewed CBC    Component Value Date/Time   WBC 10.2 11/06/2023 0610   RBC 3.64 (L) 11/06/2023 0610   HGB 11.7 (L) 11/06/2023 0610   HGB 14.1 11/24/2011 1251   HCT 35.6 (L) 11/06/2023 0610   HCT 41.7 11/24/2011 1251   PLT 349 11/06/2023 0610   PLT 353 11/24/2011 1251   MCV 97.8 11/06/2023 0610   MCV 94.2 11/24/2011 1251   MCH 32.1 11/06/2023 0610   MCHC 32.9 11/06/2023 0610   RDW 12.0 11/06/2023 0610   RDW 13.1 11/24/2011 1251   LYMPHSABS 1.8 11/06/2023  0610   LYMPHSABS 2.6 11/24/2011 1251   MONOABS 1.1 (H) 11/06/2023 0610   MONOABS 0.3 11/24/2011 1251   EOSABS 0.3 11/06/2023 0610   EOSABS 0.2 11/24/2011 1251   BASOSABS 0.0 11/06/2023 0610   BASOSABS 0.1 11/24/2011 1251    BMET    Component Value Date/Time   NA 136 11/06/2023 0610   K 3.7 11/06/2023 0610   CL 101 11/06/2023 0610   CO2 25 11/06/2023 0610   GLUCOSE 118 (H) 11/06/2023 0610   BUN 13 11/06/2023 0610   CREATININE 1.00 11/06/2023 0610   CALCIUM  8.7 (L) 11/06/2023 0610   GFRNONAA >60 11/06/2023 0610   GFRAA >60 07/06/2015 0540    BNP    Component Value Date/Time   BNP 55.9 11/06/2023 0610    ProBNP    Component Value Date/Time   PROBNP 10.9 03/16/2014 0500    Specialty Problems       Pulmonary Problems   Other forms of dyspnea   Qualifier: Diagnosis of  By: Darlean MD, Ozell B       Acute respiratory failure with hypoxemia (HCC)   Acute respiratory failure with hypoxia (HCC)   ARDS (adult  respiratory distress syndrome) (HCC)    No Known Allergies   There is no immunization history on file for this patient.  Past Medical History:  Diagnosis Date   Diabetes mellitus without complication (HCC)    Hypertension    Leukocytosis, unspecified 12/31/2013   Pulmonary embolism (HCC)    Thrombophlebitis    Traumatic injury    right hand with recent skin graft.      Tobacco History: Social History   Tobacco Use  Smoking Status Every Day   Current packs/day: 0.50   Types: Cigarettes  Smokeless Tobacco Never   Ready to quit: Not Answered Counseling given: Not Answered   Continue to not smoke  Outpatient Encounter Medications as of 11/23/2023  Medication Sig   Accu-Chek Softclix Lancets lancets Use as directed in the morning, at noon, and at bedtime.   amLODipine -olmesartan (AZOR) 5-20 MG tablet Take 1 tablet by mouth daily.   carvedilol  (COREG ) 6.25 MG tablet Take 1 tablet (6.25 mg total) by mouth 2 (two) times daily with a meal.   folic  acid (FOLVITE ) 1 MG tablet Take 1 tablet (1 mg total) by mouth daily.   guaiFENesin  (MUCINEX  PO) Take 1 tablet by mouth 2 (two) times daily as needed (Cough/Cold).   ibuprofen (ADVIL) 200 MG tablet Take 800 mg by mouth every 6 (six) hours as needed for moderate pain (pain score 4-6).   Insulin  Pen Needle 32G X 4 MM MISC Inject 4 times a day   isosorbide  mononitrate (IMDUR ) 30 MG 24 hr tablet Take 1 tablet (30 mg total) by mouth daily.   metFORMIN  (GLUCOPHAGE ) 500 MG tablet Take 1 tablet (500 mg total) by mouth 2 (two) times daily.   thiamine  (VITAMIN B1) 100 MG tablet Take 1 tablet (100 mg total) by mouth daily.   Blood Glucose Monitoring Suppl (BLOOD GLUCOSE MONITOR SYSTEM) w/Device KIT Use as directed in the morning, at noon, and at bedtime. (Patient not taking: Reported on 11/23/2023)   Glucose Blood (BLOOD GLUCOSE TEST STRIPS) STRP Use as directed in the morning, at noon, and at bedtime. (Patient not taking: Reported on 11/23/2023)   insulin  aspart (NOVOLOG ) 100 UNIT/ML FlexPen Before each meal 3 times a day, 140-199 - 2 units, 200-250 - 4 units, 251-299 - 6 units,  300-349 - 8 units,  350 or above 10 units. (Patient not taking: Reported on 11/23/2023)   Lancet Device MISC Use as directed in the morning, at noon, and at bedtime. (Patient not taking: Reported on 11/23/2023)   No facility-administered encounter medications on file as of 11/23/2023.     Review of Systems  Review of Systems  No chest pain with induration.  No orthopnea or PND.  Comprehensive review of systems otherwise negative. Physical Exam  BP (!) 128/48 (BP Location: Right Arm, Patient Position: Sitting, Cuff Size: Normal)   Pulse (!) 107   Ht 5' 10 (1.778 m)   Wt 189 lb 12.8 oz (86.1 kg)   SpO2 98%   BMI 27.23 kg/m   Wt Readings from Last 5 Encounters:  11/23/23 189 lb 12.8 oz (86.1 kg)  11/05/23 187 lb 6.3 oz (85 kg)  04/08/19 200 lb (90.7 kg)  11/30/18 204 lb (92.5 kg)  11/21/18 200 lb (90.7 kg)    BMI Readings  from Last 5 Encounters:  11/23/23 27.23 kg/m  11/05/23 26.89 kg/m  04/08/19 28.70 kg/m  11/30/18 28.86 kg/m  11/21/18 28.70 kg/m     Physical Exam General: Sitting in chair, no acute distress  Eyes: EOMI, no icterus Neck: Supple, no JVP Pulmonary: Clear, normal work of breathing Cardiovascular: Warm, no edema Abdomen: Nondistended, bowel sounds present MSK: No synovitis, no joint effusion Neuro: Normal gait, no weakness Psych: Normal mood, full affect   Assessment & Plan:   ARDS due to influenza pneumonia: On room air moving around.  Slowly increasing activity.  Advised him to continue to slowly increase activity.  Use albuterol , nebulizers as needed.  New prescription for nebulizer today.  Plan CT scan in 6 months with follow-up thereafter.  Dyspnea on exertion: Likely due to residual inflammation in the lungs, deconditioning, need to build back stamina after prolonged ICU stay.  Encouraged graduated exercise program as above.   No follow-ups on file.   Donnice JONELLE Beals, MD 11/23/2023   This appointment required 40 minutes of patient care (this includes precharting, chart review, review of results, face-to-face care, etc.).

## 2023-11-28 NOTE — Telephone Encounter (Signed)
 PT's wife (DPR) calling on FMLA ppwk due tomorrow. Pls call her @ [phone]

## 2023-11-29 ENCOUNTER — Telehealth: Payer: Self-pay | Admitting: Pulmonary Disease

## 2023-11-29 NOTE — Telephone Encounter (Signed)
Patient saw Dr. Judeth Horn on 11/23/23 for hospital follow-up.  We received FMLA form from insurance on 11/18/23.   I called patient's wife today - patient is still out of work and per Dr. Laurena Spies recommendation, will not return to work until January 17, 2024.  He is still coughing and has shortness of breath with exertion.  Is able to drive and perform all ADL's.  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  He is not able to perform these tasks at this time and is not able to work a restricted position either.  I completed the form and provided it to Dr. Judeth Horn to sign.  Form is due on 2/10 - Mrs. Hyacinth Meeker will call the insurance company to let them know of the delay.  Office notes and hospital discharge notes will be included in the fax.   $29 fee will be added to the account.

## 2023-12-01 NOTE — Telephone Encounter (Signed)
FMLA form was completed and faxed to [facility]  fax# [phone].  Included 11/23/23 office visit notes and 10/30/23 hospital discharge notes.  I called and let Mrs. Dale Clark know and also put a hard copy in the mail to her.

## 2023-12-14 ENCOUNTER — Ambulatory Visit
Admission: RE | Admit: 2023-12-14 | Discharge: 2023-12-14 | Disposition: A | Discharge status: Home / Self Care | Payer: No Typology Code available for payment source | Source: Ambulatory Visit | Attending: Orthopedic Surgery | Admitting: Orthopedic Surgery

## 2023-12-14 DIAGNOSIS — G8929 Other chronic pain: Secondary | ICD-10-CM

## 2023-12-14 MED ORDER — IOPAMIDOL (ISOVUE-M 200) INJECTION 41%
10.0000 mL | Freq: Once | INTRAMUSCULAR | Status: AC
  Administered 2023-12-14: 10 mL via INTRA_ARTICULAR

## 2024-01-09 ENCOUNTER — Ambulatory Visit (HOSPITAL_BASED_OUTPATIENT_CLINIC_OR_DEPARTMENT_OTHER)

## 2024-01-09 ENCOUNTER — Other Ambulatory Visit (HOSPITAL_BASED_OUTPATIENT_CLINIC_OR_DEPARTMENT_OTHER): Payer: Self-pay | Admitting: Adult Health

## 2024-01-09 ENCOUNTER — Encounter: Payer: Self-pay | Admitting: Pulmonary Disease

## 2024-01-09 ENCOUNTER — Encounter (HOSPITAL_BASED_OUTPATIENT_CLINIC_OR_DEPARTMENT_OTHER): Payer: Self-pay | Admitting: Adult Health

## 2024-01-09 ENCOUNTER — Ambulatory Visit (HOSPITAL_BASED_OUTPATIENT_CLINIC_OR_DEPARTMENT_OTHER): Admitting: Pulmonary Disease

## 2024-01-09 ENCOUNTER — Ambulatory Visit (HOSPITAL_BASED_OUTPATIENT_CLINIC_OR_DEPARTMENT_OTHER): Admitting: Adult Health

## 2024-01-09 VITALS — BP 126/74 | HR 108 | Ht 70.0 in | Wt 187.4 lb

## 2024-01-09 DIAGNOSIS — J80 Acute respiratory distress syndrome: Secondary | ICD-10-CM

## 2024-01-09 LAB — PULMONARY FUNCTION TEST
DL/VA % pred: 118 %
DL/VA: 5.33 ml/min/mmHg/L
DLCO cor % pred: 82 %
DLCO cor: 24.71 ml/min/mmHg
DLCO unc % pred: 82 %
DLCO unc: 24.71 ml/min/mmHg
FEF 25-75 Post: 2.59 L/s
FEF 25-75 Pre: 2.27 L/s
FEF2575-%Change-Post: 14 %
FEF2575-%Pred-Post: 70 %
FEF2575-%Pred-Pre: 61 %
FEV1-%Change-Post: 6 %
FEV1-%Pred-Post: 65 %
FEV1-%Pred-Pre: 62 %
FEV1-Post: 2.67 L
FEV1-Pre: 2.51 L
FEV1FVC-%Change-Post: 0 %
FEV1FVC-%Pred-Pre: 101 %
FEV6-%Change-Post: 6 %
FEV6-%Pred-Post: 66 %
FEV6-%Pred-Pre: 62 %
FEV6-Post: 3.34 L
FEV6-Pre: 3.14 L
FEV6FVC-%Change-Post: 0 %
FEV6FVC-%Pred-Post: 103 %
FEV6FVC-%Pred-Pre: 102 %
FVC-%Change-Post: 5 %
FVC-%Pred-Post: 64 %
FVC-%Pred-Pre: 61 %
FVC-Post: 3.34 L
FVC-Pre: 3.16 L
Post FEV1/FVC ratio: 80 %
Post FEV6/FVC ratio: 100 %
Pre FEV1/FVC ratio: 79 %
Pre FEV6/FVC Ratio: 99 %
RV % pred: 87 %
RV: 1.71 L
TLC % pred: 76 %
TLC: 5.35 L

## 2024-01-09 MED ORDER — ALBUTEROL SULFATE HFA 108 (90 BASE) MCG/ACT IN AERS: 1.0000 | INHALATION_SPRAY | Freq: Four times a day (QID) | RESPIRATORY_TRACT | 3 refills | Status: AC | PRN

## 2024-01-09 NOTE — Progress Notes (Signed)
 Full PFT Performed Today

## 2024-01-09 NOTE — Patient Instructions (Signed)
 Full PFT Performed Today

## 2024-01-09 NOTE — Progress Notes (Signed)
Pt notified at OV

## 2024-01-09 NOTE — Patient Instructions (Addendum)
 Chest xray today  PFT today .  Activity as tolerated.  Follow up with Ortho as planned.  Work on not smoking  CT chest as planned in August .  Albuterol inhaler 1-2 puffs every 6hr as needed.  Follow up with Dr. Judeth Horn in August after CT chest .

## 2024-01-09 NOTE — Telephone Encounter (Signed)
 Please advise change request

## 2024-01-09 NOTE — Progress Notes (Signed)
 @Patient  ID: Dale Clark, male    DOB: [birth year], 48 y.o.   MRN: [id number]  Chief Complaint  Patient presents with   Follow-up  Discussed the use of AI scribe software for clinical note transcription with the patient, who gave verbal consent to proceed.   Referring provider: Norm Salt, [place]  HPI: 48 year old male active smoker seen for pulmonary critical care consult during hospitalization January 2025 for acute respiratory failure secondary to ARDS due to influenza pneumonia.  TEST/EVENTS :   01/09/2024 Follow up : ARDS Pneumonia  Patient presents for a 6-week follow-up.  Patient was hospitalized for critical illness January 2025 with acute respiratory failure secondary to ARDS due to influenza pneumonia.  Patient required intubation.  He was treated with aggressive antiviral therapy, empiric antibiotics for superimposed pneumonia.  Did require diuresis for fluid overload.  He was extubated to Oxygen and nebulized bronchodilators.  Patient says he has not used oxygen since discharge.  Did not require Oxygen at home. He does continue to smoke about a half a pack of cigarettes daily.  We discussed smoking cessation  Since discharge patient is feeling better.  Gets short of breath with heavy activities such as walking long distance or going up an incline.  Patient has paperwork from his job to fill out today for return to work.  Patient does tell me that he is recent been diagnosed with a left rotator cuff tear that may require surgery and he is here for a pulmonary preop risk assessment Currently, he does not use inhalers or nebulizers and has no significant coughing or congestion, though he experiences nasal stuffiness from time to time.  During his recent illness, he developed ARDS, raising concerns about potential long-term lung damage.  He does have a history of pulmonary embolism that occurred with postop complications in 2012 .  Patient had to have a skin graft .  Says he was  treated with anticoagulation therapy for a few months and has had no history of recurrence today.  He is seeking evaluation for preoperative clearance due to a rotator cuff tear in his shoulder, diagnosed via MRI as a high-grade tear. The injury occurred slightly before his illness and worsened afterward. He is considering treatment options, including surgery or a corticosteroid injection.  He has a history of smoking. Smoking cessation is crucial to prevent further lung damage. 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 He is eager to return to work and is here to complete necessary paperwork for this purpose. He has been staying active and working on his breathing at home since his last visit. No family history of lung problems and no heart issues. He has hidradenitis, which he experiences as frequent flare-ups, but he is not on any medication for it.   Patient was able to set up pulmonary function testing today which showed moderate restriction with an FEV1 at 65%, ratio 80, FVC 64%, no significant bronchodilator response, DLCO 82%.  Positive mid flow reversibility.  We reviewed these results in detail.    Chest x-ray today shows clear lungs.  No Known Allergies   There is no immunization history on file for this patient.  Past Medical History:  Diagnosis Date   Diabetes mellitus without complication (HCC)    Hypertension    Leukocytosis, unspecified 12/31/2013   Pulmonary embolism (HCC)    Thrombophlebitis  Traumatic injury    right hand with recent skin graft.      Tobacco History: Social History   Tobacco Use  Smoking Status Every Day   Current packs/day: 0.50   Types: Cigarettes  Smokeless Tobacco Never   Ready to quit: No Counseling given: Yes   Outpatient Medications Prior to Visit  Medication Sig Dispense Refill   amLODipine-olmesartan (AZOR) 5-20 MG tablet  Take 1 tablet by mouth daily.     Blood Glucose Monitoring Suppl (BLOOD GLUCOSE MONITOR SYSTEM) w/Device KIT Use as directed in the morning, at noon, and at bedtime. 1 kit 0   carvedilol (COREG) 6.25 MG tablet Take 1 tablet (6.25 mg total) by mouth 2 (two) times daily with a meal. 60 tablet 0   Continuous Glucose Sensor (DEXCOM G6 SENSOR) MISC SMARTSIG:Topical Every 10 Days     Continuous Glucose Transmitter (DEXCOM G6 TRANSMITTER) MISC See admin instructions.     ibuprofen (ADVIL) 200 MG tablet Take 800 mg by mouth every 6 (six) hours as needed for moderate pain (pain score 4-6).     Insulin Pen Needle 32G X 4 MM MISC Inject 4 times a day 100 each 0   ipratropium-albuterol (DUONEB) 0.5-2.5 (3) MG/3ML SOLN Take 3 mLs by nebulization every 6 (six) hours as needed (wheeze, shortness of breath). 360 mL 2   isosorbide mononitrate (IMDUR) 30 MG 24 hr tablet Take 1 tablet (30 mg total) by mouth daily. 30 tablet 0   metFORMIN (GLUCOPHAGE) 500 MG tablet Take 1 tablet (500 mg total) by mouth 2 (two) times daily. 60 tablet 0   folic acid (FOLVITE) 1 MG tablet Take 1 tablet (1 mg total) by mouth daily. (Patient not taking: Reported on 01/09/2024) 30 tablet 0   guaiFENesin (MUCINEX PO) Take 1 tablet by mouth 2 (two) times daily as needed (Cough/Cold). (Patient not taking: Reported on 01/09/2024)     insulin aspart (NOVOLOG) 100 UNIT/ML FlexPen Before each meal 3 times a day, 140-199 - 2 units, 200-250 - 4 units, 251-299 - 6 units,  300-349 - 8 units,  350 or above 10 units. (Patient not taking: Reported on 01/09/2024) 15 mL 0   thiamine (VITAMIN B1) 100 MG tablet Take 1 tablet (100 mg total) by mouth daily. (Patient not taking: Reported on 01/09/2024) 30 tablet 0   No facility-administered medications prior to visit.     Review of Systems:   Constitutional:   No  weight loss, night sweats,  Fevers, chills, +fatigue, or  lassitude.  HEENT:   No headaches,  Difficulty swallowing,  Tooth/dental problems, or   Sore throat,                No sneezing, itching, ear ache, nasal congestion, post nasal drip,   CV:  No chest pain,  Orthopnea, PND, swelling in lower extremities, anasarca, dizziness, palpitations, syncope.   GI  No heartburn, indigestion, abdominal pain, nausea, vomiting, diarrhea, change in bowel habits, loss of appetite, bloody stools.   Resp:  No chest wall deformity  Skin: no rash or lesions.  GU: no dysuria, change in color of urine, no urgency or frequency.  No flank pain, no hematuria   MS: Left shoulder pain   Physical Exam  BP 126/74   Pulse (!) 108   Ht 5\' 10"  (1.778 m)   Wt 187 lb 6.4 oz (85 kg)   SpO2 97%   BMI 26.89 kg/m   GEN: A/Ox3; pleasant , NAD, well nourished    HEENT:  [place]/AT,  EACs-clear, TMs-wnl, NOSE-clear, THROAT-clear, no lesions, no postnasal drip or exudate noted.   NECK:  Supple w/ fair ROM; no JVD; normal carotid impulses w/o bruits; no thyromegaly or nodules palpated; no lymphadenopathy.    RESP  Clear  P & A; w/o, wheezes/ rales/ or rhonchi. no accessory muscle use, no dullness to percussion  CARD:  RRR, no m/r/g, no peripheral edema, pulses intact, no cyanosis or clubbing.  GI:   Soft & nt; nml bowel sounds; no organomegaly or masses detected.   Musco: Warm bil, no deformities or joint swelling noted.   Neuro: alert, no focal deficits noted.    Skin: Warm, no lesions or rashes    Lab Results:    BMET   BNP   Imaging: DG Chest 2 View Result Date: 01/09/2024 CLINICAL DATA:  Respiratory distress syndrome EXAM: CHEST - 2 VIEW COMPARISON:  Chest radiograph 11/06/2023. FINDINGS: The heart size and mediastinal contours are within normal limits. Both lungs are clear. The visualized skeletal structures are unremarkable. IMPRESSION: No active cardiopulmonary disease. Electronically Signed   By: Annia Belt M.D.   On: 01/09/2024 10:13   MR SHOULDER LEFT W CONTRAST Result Date: 12/21/2023 CLINICAL DATA:  Left shoulder pain for 4  months, no known injury EXAM: MRI OF THE LEFT SHOULDER WITH CONTRAST TECHNIQUE: Multiplanar, multisequence MR imaging of the left shoulder was performed following the administration of intra-articular contrast. CONTRAST:  See Injection Documentation. COMPARISON:  None Available. FINDINGS: Rotator cuff: Mild supraspinatus tendinosis with a high-grade partial-thickness articular surface tear along the anterior insertion. Infraspinatus tendon is intact. Teres minor tendon is intact. Subscapularis tendon is intact. Muscles: No muscle atrophy or edema. No intramuscular fluid collection or hematoma. Biceps Long Head: Intraarticular and extraarticular portions of the biceps tendon are intact. Acromioclavicular Joint: Mild arthropathy of the acromioclavicular joint. Trace subacromial/subdeltoid bursal fluid. Glenohumeral Joint: Intraarticular contrast distending the joint capsule. Normal glenohumeral ligaments. No chondral defect. Labrum: Intact. Bones: No fracture or dislocation. No aggressive osseous lesion. Other: No fluid collection or hematoma. IMPRESSION: 1. Mild supraspinatus tendinosis with a high-grade partial-thickness articular surface tear along the anterior insertion. Electronically Signed   By: Elige Ko M.D.   On: 12/21/2023 13:48   DG FLUORO GUIDED NEEDLE PLC ASPIRATION/INJECTION LOC Result Date: 12/14/2023 CLINICAL DATA:  48 year old male with chronic left shoulder pain. EXAM: LEFT SHOULDER INJECTION UNDER FLUOROSCOPY TECHNIQUE: An appropriate skin entrance site was determined. The site was marked, prepped with Betadine, draped in the usual sterile fashion, and infiltrated locally with buffered Lidocaine. 22 gauge spinal needle was advanced to the superomedial margin of the humeral head under intermittent fluoroscopy. 1 ml of Lidocaine injected easily. A mixture of 0.05 ml Multihance 10 ml of Omnipaque 300 was then used to opacify the left shoulder capsule. No immediate complication. FLUOROSCOPY:  Radiation Exposure Index (as provided by the fluoroscopic device): 1.0 mGy Kerma IMPRESSION: Technically successful fluoroscopic guided left shoulder injection for MRI. Marliss Coots, MD [facility] Electronically Signed   By: Marliss Coots M.D.   On: 12/14/2023 15:28    Administration History     None          Latest Ref Rng & Units 01/09/2024    1:34 PM  PFT Results  FVC-Pre L 3.16  P  FVC-Predicted Pre % 61  P  FVC-Post L 3.34  P  FVC-Predicted Post % 64  P  Pre FEV1/FVC % % 79  P  Post FEV1/FCV % %  80  P  FEV1-Pre L 2.51  P  FEV1-Predicted Pre % 62  P  FEV1-Post L 2.67  P  DLCO uncorrected ml/min/mmHg 24.71  P  DLCO UNC% % 82  P  DLCO corrected ml/min/mmHg 24.71  P  DLCO COR %Predicted % 82  P  DLVA Predicted % 118  P  TLC L 5.35  P  TLC % Predicted % 76  P  RV % Predicted % 87  P    P Preliminary result    No results found for: "NITRICOXIDE"      Assessment & Plan:   Assessment and Plan    Influenza Pneumonia with ARDS   Severe pneumonia secondary to influenza in January 2025 required prolonged hospitalization with mechanical ventilation. He currently needs no oxygen at home. . Smoking cessation is crucial . Order a chest x-ray to assess status. Has HRCT chest planned for June 2025 to evaluate for scarring from ARDS.  PFT today shows moderate restriction with normal diffusing capacity. May try albuterol inhaler as needed.  Could have a component of some reactive airways/mild intermittent asthma.  Also early COPD with smoking history.  Would consider repeating spirometry with DLCO in 6 months.  Tobacco abuse.  Smoking cessation discussed in detail.  PFTs show moderate restriction.  May try albuterol inhaler as needed.  Rotator cuff tear   A high-grade rotator cuff tear with a partial tear was identified on MRI, with symptoms worsening after a recent illness.  Continue following up with orthopedics.    Work forms were completed with restrictions for lifting.  May return to work on January 17, 2024.  Will need further ongoing evaluation with orthopedics if patient proceeds with surgical intervention.    Pulmonary preop risk assessment.-From a pulmonary standpoint patient would be a mild surgical risk.  He appears to have recovered from influenza and pneumonia with ARDS.  He is fully independent.  Not on home oxygen.  Walk test today in the office shows no desaturations on room air.  PFTs show moderate restriction with normal diffusing capacity.  Major Pulmonary risks identified in the multifactorial risk analysis are but not limited to a) pneumonia; b) recurrent intubation risk; c) prolonged or recurrent acute respiratory failure needing mechanical ventilation; d) prolonged hospitalization; e) DVT/Pulmonary embolism; f) Acute Pulmonary edema  Recommend 1. Short duration of surgery as much as possible and avoid paralytic if possible . Aggressive pulmonary toilet with o2, bronchodilatation, and incentive spirometry and early ambulation    Hypertension and Diabetes Mellitus  Follow up with a primary care provider for management of hypertension and diabetes.       I spent   50 minutes dedicated to the care of this patient on the date of this encounter to include pre-visit review of records, face-to-face time with the patient discussing conditions above, post visit ordering of testing, clinical documentation with the electronic health record, making appropriate referrals as documented, and communicating necessary findings to members of the patients care team.    Rubye Oaks, NP 01/09/2024

## 2024-01-20 ENCOUNTER — Encounter (HOSPITAL_BASED_OUTPATIENT_CLINIC_OR_DEPARTMENT_OTHER): Payer: Self-pay

## 2024-02-16 ENCOUNTER — Ambulatory Visit: Payer: Self-pay | Admitting: Pulmonary Disease

## 2024-02-16 NOTE — Telephone Encounter (Signed)
 E2C2 Pulmonary Triage - Initial Assessment Questions "Chief Complaint (e.g., cough, sob, wheezing, fever, chills, sweat or additional symptoms) *Go to specific symptom protocol after initial questions. Patient with hx of ARDS from flu and PNA back in jan 2025. Patient reports shortness of breath that started today-endorses cough and congestion. Patient reports mild shortness of breath currently. Patient is speaking in full sentences without taking any breaks. Patient's pulse ox is 98% on room air-reports that he doesn't check his oxygen saturation regularly. Endorses he hasn't tried his nebulizer today. Reports using cough drops and Dayquil so far today. Patient is encouraged to use his nebulizer to see if that improves his symptoms. Patient is instructed to call back if symptoms don't improve. Patient states he was unsure when his next appointment is with pulmonary. Patient may need a follow up call from staff.   "How long have symptoms been present?" Symptoms started today.   Have you tested for COVID or Flu? Note: If not, ask patient if a home test can be taken. If so, instruct patient to call back for positive results. Yes negative test  MEDICINES:   "Have you used any OTC meds to help with symptoms?" Yes If yes, ask "What medications?" Cough drops DayQuil  "Have you used your inhalers/maintenance medication?" Yes If yes, "What medications?" Albuterol  inhaler Duoneb nebulizer q6hr PRN  If inhaler, ask "How many puffs and how often?" Note: Review instructions on medication in the chart. Albuterol  inhaler 2 puffs every 6h  OXYGEN: "Do you wear supplemental oxygen?" No If yes, "How many liters are you supposed to use?"   "Do you monitor your oxygen levels?" Yes If yes, "What is your reading (oxygen level) today?" 98%  "What is your usual oxygen saturation reading?"  (Note: Pulmonary O2 sats should be 90% or greater) Patient states he doesn't check oxygen saturation regularly.     Copied from CRM [id number]. Topic: Clinical - Red Word Triage >> Feb 16, 2024 10:45 AM Hilton Lucky wrote: Red Word that prompted transfer to Nurse Triage: shortness of breath onset of Friday - was admitted on a Sunday (seems like some time ago) but wants to speak to a nurse.  Ardelia Beau is calling - wife. Reason for Disposition  [1] MILD difficulty breathing (e.g., minimal/no SOB at rest, SOB with walking, pulse <100) AND [2] NEW-onset or WORSE than normal  Answer Assessment - Initial Assessment Questions 1. RESPIRATORY STATUS: "Describe your breathing?" (e.g., wheezing, shortness of breath, unable to speak, severe coughing)      Shortness of breath 2. ONSET: "When did this breathing problem begin?"      Started today 3. PATTERN "Does the difficult breathing come and go, or has it been constant since it started?"      Comes and goes 4. SEVERITY: "How bad is your breathing?" (e.g., mild, moderate, severe)    - MILD: No SOB at rest, mild SOB with walking, speaks normally in sentences, can lie down, no retractions, pulse < 100.    - MODERATE: SOB at rest, SOB with minimal exertion and prefers to sit, cannot lie down flat, speaks in phrases, mild retractions, audible wheezing, pulse 100-120.    - SEVERE: Very SOB at rest, speaks in single words, struggling to breathe, sitting hunched forward, retractions, pulse > 120      Mild 5. RECURRENT SYMPTOM: "Have you had difficulty breathing before?" If Yes, ask: "When was the last time?" and "What happened that time?"      no 6. CARDIAC HISTORY: "  Do you have any history of heart disease?" (e.g., heart attack, angina, bypass surgery, angioplasty)       7. LUNG HISTORY: "Do you have any history of lung disease?"  (e.g., pulmonary embolus, asthma, emphysema)     ARDS 8. CAUSE: "What do you think is causing the breathing problem?"      unsure 9. OTHER SYMPTOMS: "Do you have any other symptoms? (e.g., dizziness, runny nose, cough, chest pain, fever)      Congestion, cough 10. O2 SATURATION MONITOR:  "Do you use an oxygen saturation monitor (pulse oximeter) at home?" If Yes, ask: "What is your reading (oxygen level) today?" "What is your usual oxygen saturation reading?" (e.g., 95%)       98% on room air 12. TRAVEL: "Have you traveled out of the country in the last month?" (e.g., travel history, exposures)       no  Protocols used: Breathing Difficulty-A-AH

## 2024-02-16 NOTE — Telephone Encounter (Signed)
 FYI to Dr Marygrace Snellen. Safe enough to just schedule a f/u? Do you advise anything further?

## 2024-02-16 NOTE — Telephone Encounter (Signed)
 See if can see anyone in f/u tomorrow or next week. If not improving needs to get to ED.

## 2024-02-16 NOTE — Telephone Encounter (Signed)
 Please see if pt can be scheduled tomorrow. If not, let triage know so we can advise pt to go to the ED.

## 2024-02-20 NOTE — Telephone Encounter (Signed)
Any availability this week?

## 2024-03-11 ENCOUNTER — Encounter (HOSPITAL_BASED_OUTPATIENT_CLINIC_OR_DEPARTMENT_OTHER): Payer: Self-pay | Admitting: Emergency Medicine

## 2024-03-11 ENCOUNTER — Other Ambulatory Visit: Payer: Self-pay

## 2024-03-11 ENCOUNTER — Emergency Department (HOSPITAL_BASED_OUTPATIENT_CLINIC_OR_DEPARTMENT_OTHER)

## 2024-03-11 ENCOUNTER — Emergency Department (HOSPITAL_BASED_OUTPATIENT_CLINIC_OR_DEPARTMENT_OTHER)
Admission: EM | Admit: 2024-03-11 | Discharge: 2024-03-11 | Disposition: A | Discharge status: Home / Self Care | Attending: Emergency Medicine | Admitting: Emergency Medicine

## 2024-03-11 DIAGNOSIS — Z7984 Long term (current) use of oral hypoglycemic drugs: Secondary | ICD-10-CM | POA: Diagnosis not present

## 2024-03-11 DIAGNOSIS — E119 Type 2 diabetes mellitus without complications: Secondary | ICD-10-CM | POA: Diagnosis not present

## 2024-03-11 DIAGNOSIS — R000 Tachycardia, unspecified: Secondary | ICD-10-CM | POA: Insufficient documentation

## 2024-03-11 DIAGNOSIS — I10 Essential (primary) hypertension: Secondary | ICD-10-CM | POA: Insufficient documentation

## 2024-03-11 DIAGNOSIS — M25571 Pain in right ankle and joints of right foot: Secondary | ICD-10-CM | POA: Insufficient documentation

## 2024-03-11 DIAGNOSIS — Z79899 Other long term (current) drug therapy: Secondary | ICD-10-CM | POA: Insufficient documentation

## 2024-03-11 DIAGNOSIS — Z794 Long term (current) use of insulin: Secondary | ICD-10-CM | POA: Diagnosis not present

## 2024-03-11 NOTE — ED Triage Notes (Signed)
 Pt w/ RT ankle pain x 1 wk; no injury; amb to triage

## 2024-03-11 NOTE — ED Provider Notes (Signed)
 [facility] Provider Note   CSN: [id number] Arrival date & time: 03/11/24  1626     History  Chief Complaint  Patient presents with   Ankle Pain    Dale Clark is a 48 y.o. male.  48 year old male presenting with right ankle pain.  Patient states that symptoms have been ongoing for about 1 week, he denies any known inciting event/injury.  Patient reports that pain is more severe in the morning, he has been taking ibuprofen which does provide him with symptomatic relief.  He has no other complaints at this time.   Ankle Pain      Home Medications Prior to Admission medications   Medication Sig Start Date End Date Taking? Authorizing Provider  albuterol  (VENTOLIN  HFA) 108 (90 Base) MCG/ACT inhaler Inhale 1-2 puffs into the lungs every 6 (six) hours as needed for wheezing or shortness of breath. 01/09/24   Parrett, Macdonald Savoy, NP  amLODipine -olmesartan (AZOR) 5-20 MG tablet Take 1 tablet by mouth daily. 10/28/23   [provider]  Blood Glucose Monitoring Suppl (BLOOD GLUCOSE MONITOR SYSTEM) w/Device KIT Use as directed in the morning, at noon, and at bedtime. 11/07/23   Singh, Prashant K, MD  carvedilol  (COREG ) 6.25 MG tablet Take 1 tablet (6.25 mg total) by mouth 2 (two) times daily with a meal. 11/07/23   Cala Castleman, MD  Continuous Glucose Sensor (DEXCOM G6 SENSOR) MISC SMARTSIG:Topical Every 10 Days 12/14/23   [provider]  Continuous Glucose Transmitter (DEXCOM G6 TRANSMITTER) MISC See admin instructions. 12/19/23   [provider]  folic acid  (FOLVITE ) 1 MG tablet Take 1 tablet (1 mg total) by mouth daily. Patient not taking: Reported on 01/09/2024 11/07/23   Singh, Prashant K, MD  guaiFENesin  (MUCINEX  PO) Take 1 tablet by mouth 2 (two) times daily as needed (Cough/Cold). Patient not taking: Reported on 01/09/2024    [provider]  ibuprofen (ADVIL) 200 MG tablet Take 800 mg by mouth every 6  (six) hours as needed for moderate pain (pain score 4-6).    [provider]  insulin  aspart (NOVOLOG ) 100 UNIT/ML FlexPen Before each meal 3 times a day, 140-199 - 2 units, 200-250 - 4 units, 251-299 - 6 units,  300-349 - 8 units,  350 or above 10 units. Patient not taking: Reported on 01/09/2024 11/07/23   Singh, Prashant K, MD  Insulin  Pen Needle 32G X 4 MM MISC Inject 4 times a day 11/07/23   Singh, Prashant K, MD  ipratropium-albuterol  (DUONEB) 0.5-2.5 (3) MG/3ML SOLN Take 3 mLs by nebulization every 6 (six) hours as needed (wheeze, shortness of breath). 11/23/23   Hunsucker, Archer Kobs, MD  isosorbide  mononitrate (IMDUR ) 30 MG 24 hr tablet Take 1 tablet (30 mg total) by mouth daily. 11/07/23   Singh, Prashant K, MD  metFORMIN  (GLUCOPHAGE ) 500 MG tablet Take 1 tablet (500 mg total) by mouth 2 (two) times daily. 11/07/23   Singh, Prashant K, MD  thiamine  (VITAMIN B1) 100 MG tablet Take 1 tablet (100 mg total) by mouth daily. Patient not taking: Reported on 01/09/2024 11/07/23   Singh, Prashant K, MD      Allergies    Patient has no known allergies.    Review of Systems   Review of Systems  Physical Exam Updated Vital Signs BP 126/78 (BP Location: Right Arm)   Pulse (!) 101   Temp 98.6 F (37 C) (Oral)   Resp 18   Ht 5'  10" (1.778 m)   Wt 86.2 kg   SpO2 96%   BMI 27.26 kg/m  Physical Exam Vitals and nursing note reviewed.  HENT:     Head: Normocephalic.  Eyes:     Extraocular Movements: Extraocular movements intact.  Neck:     Comments: Scarring of the posterior neck consistent with hidradenitis suppurativa Golf ball sized mass of the left lateral neck below the ear, mass is soft and nontender to palpation, chronic in appearance Cardiovascular:     Rate and Rhythm: Tachycardia present.  Pulmonary:     Effort: Pulmonary effort is normal.  Musculoskeletal:     Comments: Moves all extremities spontaneously without difficulty RLE: Full range of motion at the  hip/knee/ankle/toes.  Mildly tender to palpation of the lateral malleolus, no bruising/deformity. 2+ DP pulse.  Skin:    General: Skin is warm and dry.  Neurological:     Mental Status: He is alert.     Comments: Patient ambulates on his own without difficulty, is able to bear full weight on his right lower extremity     ED Results / Procedures / Treatments   Labs (all labs ordered are listed, but only abnormal results are displayed) Labs Reviewed - No data to display  EKG None  Radiology DG Ankle Complete Right Result Date: 03/11/2024 CLINICAL DATA:  Right ankle pain for 1 week.  No known injury. EXAM: RIGHT ANKLE - COMPLETE 3+ VIEW COMPARISON:  None Available. FINDINGS: There is no evidence of fracture, dislocation, or joint effusion. There is no evidence of arthropathy or other focal bone abnormality. Soft tissues are unremarkable. IMPRESSION: Negative. Electronically Signed   By: Wyvonnia Heimlich M.D.   On: 03/11/2024 17:19    Procedures Procedures    Medications Ordered in ED Medications - No data to display  ED Course/ Medical Decision Making/ A&P                                 Medical Decision Making This patient presents to the ED for concern of R ankle pain, this involves an extensive number of treatment options, and is a complaint that carries with it a high risk of complications and morbidity.  The differential diagnosis includes fracture, dislocation, sprain/strain, septic joint.    Co morbidities that complicate the patient evaluation  Polyclonal gammopathy, Type 2 diabetes, hypertension, hyperlipidemia, PE, hidradenitis suppurativa   Additional history obtained:  Additional history obtained from record review External records from outside source obtained and reviewed including recent PCP note   Imaging Studies ordered:  I ordered imaging studies including complete x-ray series of right ankle I independently visualized and interpreted imaging which showed  negative for acute fracture/dislocation I agree with the radiologist interpretation    Problem List / ED Course / Critical interventions / Medication management  I have reviewed the patients home medicines and have made adjustments as needed   Social Determinants of Health:  Tobacco use   Test / Admission - Considered:  Physical exam is notable for tenderness to palpation of the right lateral malleolus, there is no bruising/deformity.  Physical exam was also notable for mass of left lateral aspect of patient's neck, there is no tenderness to palpation of the mass, per patient/his wife he has been evaluated for this issue in the past and it has been present for "a long time", he has a history of hidradenitis suppurativa.  Patient is able to ambulate  on his own while bearing full weight on the right lower extremity.  X-ray results as above, no acute fracture/dislocation.  Advised patient to continue ibuprofen as needed for pain.  He is already established with an orthopedist, Dr. Case, recommend patient follow-up with him in regard to this issue.  Will provide patient with ankle brace to be used as needed for comfort. Patient voiced understanding is agreeable with this plan.  Return to the emergency department if symptoms worsen.    Amount and/or Complexity of Data Reviewed Radiology: ordered.           Final Clinical Impression(s) / ED Diagnoses Final diagnoses:  Acute right ankle pain    Rx / DC Orders ED Discharge Orders     None         Kendrick Pax, PA-C 03/11/24 1735    Albertus Hughs, DO 03/11/24 2018

## 2024-03-11 NOTE — Discharge Instructions (Addendum)
 Continue ibuprofen as needed for pain, continue to use ankle brace as needed for support.  Contact your orthopedist Dr. Case for follow-up regarding your symptoms.  Return to the emergency department if your symptoms worsen.

## 2024-05-09 ENCOUNTER — Ambulatory Visit
Admission: RE | Admit: 2024-05-09 | Discharge: 2024-05-09 | Disposition: A | Discharge status: Home / Self Care | Source: Ambulatory Visit | Attending: Pulmonary Disease | Admitting: Pulmonary Disease

## 2024-05-09 DIAGNOSIS — J80 Acute respiratory distress syndrome: Secondary | ICD-10-CM
